# Patient Record
Sex: Female | Born: 1990 | Race: White | Hispanic: No | Marital: Single | Smoking: Never smoker
Health system: Southern US, Community
[De-identification: ages and names within clinical notes are randomized; demographics above are authoritative.]

## PROBLEM LIST (undated history)

## (undated) DIAGNOSIS — N289 Disorder of kidney and ureter, unspecified: Secondary | ICD-10-CM

## (undated) HISTORY — PX: TONSILLECTOMY: SUR1361

## (undated) HISTORY — PX: ORTHOPEDIC SURGERY: SHX850

---

## 2011-12-17 DIAGNOSIS — F988 Other specified behavioral and emotional disorders with onset usually occurring in childhood and adolescence: Secondary | ICD-10-CM | POA: Insufficient documentation

## 2018-12-08 DIAGNOSIS — G5600 Carpal tunnel syndrome, unspecified upper limb: Secondary | ICD-10-CM | POA: Insufficient documentation

## 2020-07-04 ENCOUNTER — Emergency Department (HOSPITAL_COMMUNITY)
Admission: EM | Admit: 2020-07-04 | Discharge: 2020-07-05 | Disposition: A | Discharge status: Home / Self Care | Payer: Medicaid Other | Attending: Emergency Medicine | Admitting: Emergency Medicine

## 2020-07-04 ENCOUNTER — Other Ambulatory Visit: Payer: Self-pay

## 2020-07-04 ENCOUNTER — Encounter (HOSPITAL_COMMUNITY): Payer: Self-pay | Admitting: *Deleted

## 2020-07-04 ENCOUNTER — Emergency Department (HOSPITAL_COMMUNITY): Payer: Medicaid Other

## 2020-07-04 DIAGNOSIS — R102 Pelvic and perineal pain: Secondary | ICD-10-CM

## 2020-07-04 DIAGNOSIS — Z3A01 Less than 8 weeks gestation of pregnancy: Secondary | ICD-10-CM | POA: Insufficient documentation

## 2020-07-04 DIAGNOSIS — Z3490 Encounter for supervision of normal pregnancy, unspecified, unspecified trimester: Secondary | ICD-10-CM

## 2020-07-04 DIAGNOSIS — R109 Unspecified abdominal pain: Secondary | ICD-10-CM | POA: Insufficient documentation

## 2020-07-04 DIAGNOSIS — M545 Low back pain, unspecified: Secondary | ICD-10-CM

## 2020-07-04 DIAGNOSIS — O26891 Other specified pregnancy related conditions, first trimester: Secondary | ICD-10-CM | POA: Diagnosis present

## 2020-07-04 LAB — BASIC METABOLIC PANEL
Anion gap: 11 (ref 5–15)
BUN: 9 mg/dL (ref 6–20)
CO2: 21 mmol/L — ABNORMAL LOW (ref 22–32)
Calcium: 8.9 mg/dL (ref 8.9–10.3)
Chloride: 104 mmol/L (ref 98–111)
Creatinine, Ser: 0.5 mg/dL (ref 0.44–1.00)
GFR calc Af Amer: >60 mL/min (ref >60)
GFR calc non Af Amer: >60 mL/min (ref >60)
Glucose, Bld: 82 mg/dL (ref 70–99)
Potassium: 3.9 mmol/L (ref 3.5–5.1)
Sodium: 136 mmol/L (ref 135–145)

## 2020-07-04 LAB — CBC
HCT: 39.5 % (ref 36.0–46.0)
Hemoglobin: 13.2 g/dL (ref 12.0–15.0)
MCH: 30.1 pg (ref 26.0–34.0)
MCHC: 33.4 g/dL (ref 30.0–36.0)
MCV: 90.2 fL (ref 80.0–100.0)
Platelets: 215 10*3/uL (ref 150–400)
RBC: 4.38 MIL/uL (ref 3.87–5.11)
RDW: 12.7 % (ref 11.5–15.5)
WBC: 10.1 10*3/uL (ref 4.0–10.5)
nRBC: 0 % (ref 0.0–0.2)

## 2020-07-04 LAB — HCG, QUANTITATIVE, PREGNANCY: hCG, Beta Chain, Quant, S: 2496 m[IU]/mL — ABNORMAL HIGH (ref <5)

## 2020-07-04 LAB — ABO/RH: ABO/RH(D): A POS

## 2020-07-04 NOTE — ED Provider Notes (Signed)
[facility] EMERGENCY DEPARTMENT Provider Note   CSN: [id number] Arrival date & time: 07/04/20  1544     History Chief Complaint  Patient presents with  . Abdominal Cramping    Chelsea Little is a 29 y.o. female.  Patient reports intermittent lower back cramping for the past 5 days.  This comes and goes lasting for several minutes to hours at a time.  She has this pain similar in the past when she had a miscarriage.  She became concerned today took her pregnancy test which was positive.  She is not uncertain when her last menstrual cycle was states she has irregular periods.  Denies any abdominal pain or vaginal bleeding.  No dysuria or frequency.  No vaginal discharge.  No fevers, chills, chest pain, shortness of breath.  No abdominal pain.  Reports that she had a miscarriage at 9 weeks several years ago.  She also has a 33-year-old child.  Is her third pregnancy.  He does not have a PCP in the area. Does not have any abdominal pain or back pain currently.  The history is provided by the patient.  Abdominal Cramping Pertinent negatives include no chest pain, no abdominal pain, no headaches and no shortness of breath.       History reviewed. No pertinent past medical history.  There are no problems to display for this patient.   Past Surgical History:  Procedure Laterality Date  . ORTHOPEDIC SURGERY    . TONSILLECTOMY       OB History   No obstetric history on file.     No family history on file.  Social History   Tobacco Use  . Smoking status: Never Smoker  . Smokeless tobacco: Never Used  Substance Use Topics  . Alcohol use: Not on file  . Drug use: Not on file    Home Medications Prior to Admission medications   Not on File    Allergies    Patient has no known allergies.  Review of Systems   Review of Systems  Constitutional: Negative for activity change, appetite change and fever.  HENT: Negative for congestion and rhinorrhea.   Eyes: Negative for  visual disturbance.  Respiratory: Negative for cough and shortness of breath.   Cardiovascular: Negative for chest pain.  Gastrointestinal: Negative for abdominal pain, nausea and vomiting.  Genitourinary: Negative for decreased urine volume, dysuria, hematuria, pelvic pain, urgency, vaginal bleeding and vaginal discharge.  Musculoskeletal: Positive for back pain.  Neurological: Negative for dizziness, weakness and headaches.   all other systems are negative except as noted in the HPI and PMH.    Physical Exam Updated Vital Signs BP 135/69 (BP Location: Right Arm)   Pulse 78   Temp 98.6 F (37 C) (Oral)   Resp 16   Ht 5\' 1"  (1.549 m)   Wt 72.6 kg   SpO2 100%   BMI 30.23 kg/m   Physical Exam Vitals and nursing note reviewed.  Constitutional:      General: She is not in acute distress.    Appearance: She is well-developed.  HENT:     Head: Normocephalic and atraumatic.     Mouth/Throat:     Pharynx: No oropharyngeal exudate.  Eyes:     Conjunctiva/sclera: Conjunctivae normal.     Pupils: Pupils are equal, round, and reactive to light.  Neck:     Comments: No meningismus. Cardiovascular:     Rate and Rhythm: Normal rate and regular rhythm.     Heart sounds: Normal  heart sounds. No murmur heard.   Pulmonary:     Effort: Pulmonary effort is normal. No respiratory distress.     Breath sounds: Normal breath sounds.  Abdominal:     Palpations: Abdomen is soft.     Tenderness: There is no abdominal tenderness. There is no guarding or rebound.     Comments: Soft and nontender abdomen  Genitourinary:    Comments: Chaperone present. Kayla RN normal external genitalia.  White discharge in vaginal vault.  No CMT.  Cervix is closed.  No lateralizing adnexal tenderness Musculoskeletal:        General: No tenderness. Normal range of motion.     Cervical back: Normal range of motion and neck supple.     Comments: No CVA tenderness  Skin:    General: Skin is warm.  Neurological:       Mental Status: She is alert and oriented to person, place, and time.     Cranial Nerves: No cranial nerve deficit.     Motor: No abnormal muscle tone.     Coordination: Coordination normal.     Comments: No ataxia on finger to nose bilaterally. No pronator drift. 5/5 strength throughout. CN 2-12 intact.Equal grip strength. Sensation intact.   Psychiatric:        Behavior: Behavior normal.     ED Results / Procedures / Treatments   Labs (all labs ordered are listed, but only abnormal results are displayed) Labs Reviewed  HCG, QUANTITATIVE, PREGNANCY - Abnormal; Notable for the following components:      Result Value   hCG, Beta Chain, Quant, S 2,496 (*)    All other components within normal limits  BASIC METABOLIC PANEL - Abnormal; Notable for the following components:   CO2 21 (*)    All other components within normal limits  WET PREP, GENITAL  CBC  URINALYSIS, ROUTINE W REFLEX MICROSCOPIC  ABO/RH  GC/CHLAMYDIA PROBE AMP ([place]) NOT AT [facility]    EKG None  Radiology US OB LESS THAN 14 WEEKS WITH OB TRANSVAGINAL  Result Date: 07/04/2020 CLINICAL DATA:  29 year old pregnant female with pelvic pain. EXAM: OBSTETRIC <14 WK [place] AND TRANSVAGINAL OB US TECHNIQUE: Both transabdominal and transvaginal ultrasound examinations were performed for complete evaluation of the gestation as well as the maternal uterus, adnexal regions, and pelvic cul-de-sac. Transvaginal technique was performed to assess early pregnancy. COMPARISON:  None. FINDINGS: Intrauterine gestational sac: Single intrauterine gestational sac. Yolk sac:  Seen Embryo:  Not present Cardiac Activity: N/A MSD: 6 mm   5 w   2 d Subchorionic hemorrhage:  Small subchorionic hemorrhage. Maternal uterus/adnexae: The maternal ovaries are unremarkable. No significant free fluid within the pelvis. IMPRESSION: Single intrauterine gestational sac with an estimated gestational age of [redacted] weeks, 2 days. No fetal pole identified at this  time. Follow-up with ultrasound in 7-11 days, or earlier if clinically indicated, recommended. Electronically Signed   By: Elgie Collard M.D.   On: 07/04/2020 21:00    Procedures Procedures (including critical care time)  Medications Ordered in ED Medications - No data to display  ED Course  I have reviewed the triage vital signs and the nursing notes.  Pertinent labs & imaging results that were available during my care of the patient were reviewed by me and considered in my medical decision making (see chart for details).    MDM Rules/Calculators/A&P  5 days of intermittent cramping across her low back consistent with previous episode of miscarriage.  Found to be pregnant today at home when she became concerned.  No abdominal pain or vaginal bleeding.  Abdomen is soft without peritoneal signs.  hCG today is positive at 2400.  Ultrasound shows single intrauterine gestational sac at 5 weeks 2 days without fetal pole.  UA negative.  Rh+. Not a rhogam candidate. No bleeding.   Followup with gynecology for repeat US in 7-10 days. D/w patient cannot determine whether this will be a miscarriage or a normal pregnancy.  Needs repeat US to assess progression of this pregnancy. Return to the ED with worsening pain, bleeding, or any other concerns.   Final Clinical Impression(s) / ED Diagnoses Final diagnoses:  Early stage of pregnancy  Acute bilateral low back pain without sciatica    Rx / DC Orders ED Discharge Orders    None       Vira Chaplin, Jeannett Senior, MD 07/05/20 0106

## 2020-07-05 LAB — URINALYSIS, ROUTINE W REFLEX MICROSCOPIC
Bilirubin Urine: NEGATIVE
Glucose, UA: NEGATIVE mg/dL
Hgb urine dipstick: NEGATIVE
Ketones, ur: NEGATIVE mg/dL
Leukocytes,Ua: NEGATIVE
Nitrite: NEGATIVE
Protein, ur: NEGATIVE mg/dL
Specific Gravity, Urine: 1.016 (ref 1.005–1.030)
pH: 5 (ref 5.0–8.0)

## 2020-07-05 LAB — WET PREP, GENITAL
Clue Cells Wet Prep HPF POC: NONE SEEN
Sperm: NONE SEEN
Trich, Wet Prep: NONE SEEN
Yeast Wet Prep HPF POC: NONE SEEN

## 2020-07-05 NOTE — Discharge Instructions (Signed)
AS we discussed, your ultrasound shows a gestational sac but not a fetal pole. You need to have another ultrasound in 7-10 days. At this time, we cannot determine whether this will be a normal pregnancy or a miscarriage. You should follow up with the gynecologist next week. Return to the ED if you develop worsening pain, bleeding, vomiting, fever, or any other concerns.

## 2020-07-06 LAB — GC/CHLAMYDIA PROBE AMP (~~LOC~~) NOT AT ARMC
Chlamydia: NEGATIVE
Comment: NEGATIVE
Comment: NORMAL
Neisseria Gonorrhea: NEGATIVE

## 2020-10-05 DIAGNOSIS — Z8279 Family history of other congenital malformations, deformations and chromosomal abnormalities: Secondary | ICD-10-CM | POA: Insufficient documentation

## 2020-10-05 DIAGNOSIS — Z141 Cystic fibrosis carrier: Secondary | ICD-10-CM | POA: Insufficient documentation

## 2020-10-11 DIAGNOSIS — G43909 Migraine, unspecified, not intractable, without status migrainosus: Secondary | ICD-10-CM | POA: Insufficient documentation

## 2020-12-02 ENCOUNTER — Ambulatory Visit: Admit: 2020-12-02 | Discharge status: Against Medical Advice | Payer: Medicaid Other

## 2021-05-27 ENCOUNTER — Other Ambulatory Visit: Payer: Self-pay

## 2021-05-27 ENCOUNTER — Ambulatory Visit
Admission: RE | Admit: 2021-05-27 | Discharge: 2021-05-27 | Disposition: A | Discharge status: Home / Self Care | Payer: Medicaid Other | Source: Ambulatory Visit

## 2021-05-27 VITALS — BP 116/76 | HR 92 | Temp 98.4°F | Resp 16

## 2021-05-27 DIAGNOSIS — N6001 Solitary cyst of right breast: Secondary | ICD-10-CM

## 2021-05-27 MED ORDER — CEPHALEXIN 500 MG PO CAPS
500.0000 mg | ORAL_CAPSULE | Freq: Three times a day (TID) | ORAL | 0 refills | Status: AC
Start: 2021-05-27 — End: 2021-06-06

## 2021-05-27 NOTE — ED Triage Notes (Signed)
Patient presents to Urgent Care with complaints of a "spot on right nipple that was drained in Jan" Pt states a week ago she developed same sore and with breast feeding she noted drainage. [facility] come in for eval.  Denies fever.

## 2021-05-27 NOTE — ED Provider Notes (Signed)
[facility]    CSN: [id number] Arrival date & time: 05/27/21  1056      History   Chief Complaint Chief Complaint  Patient presents with   Appointment    1100    Breast Discharge    HPI Chelsea Little is a 30 y.o. female.   HPI Patient presents with cyst which is actively draining from the right nipple. She developed a similar cyst in the same area which required I&D. The cyst at present has drained over the last week. She is breastfeeding and concern for possible infection.No fever. She I producing milk normally. History reviewed. No pertinent past medical history.  There are no problems to display for this patient.   Past Surgical History:  Procedure Laterality Date   CESAREAN SECTION     ORTHOPEDIC SURGERY     TONSILLECTOMY      OB History   No obstetric history on file.      Home Medications    Prior to Admission medications   Medication Sig Start Date End Date Taking? Authorizing Provider  cephALEXin (KEFLEX) 500 MG capsule Take 1 capsule (500 mg total) by mouth 3 (three) times daily for 10 days. 05/27/21 06/06/21 Yes Bing Neighbors, FNP  norethindrone (MICRONOR) 0.35 MG tablet Take 1 tablet by mouth daily. 05/20/21   [provider]    Family History History reviewed. No pertinent family history.  Social History Social History   Tobacco Use   Smoking status: Never   Smokeless tobacco: Never  Substance Use Topics   Alcohol use: Not Currently   Drug use: Never     Allergies   Topiramate, Gabapentin, Sumatriptan, Amoxicillin-pot clavulanate, and Rizatriptan   Review of Systems Review of Systems Pertinent negatives listed in HPI   Physical Exam Triage Vital Signs ED Triage Vitals  Enc Vitals Group     BP 05/27/21 1110 116/76     Pulse Rate 05/27/21 1110 92     Resp 05/27/21 1110 16     Temp 05/27/21 1110 98.4 F (36.9 C)     Temp Source 05/27/21 1110 Oral     SpO2 05/27/21 1110 98 %     Weight --      Height --       Head Circumference --      Peak Flow --      Pain Score 05/27/21 1107 3     Pain Loc --      Pain Edu? --      Excl. in GC? --    No data found.  Updated Vital Signs BP 116/76 (BP Location: Right Arm)   Pulse 92   Temp 98.4 F (36.9 C) (Oral)   Resp 16   SpO2 98%   Breastfeeding Yes   Visual Acuity Right Eye Distance:   Left Eye Distance:   Bilateral Distance:    Right Eye Near:   Left Eye Near:    Bilateral Near:     Physical Exam Exam conducted with a chaperone present.  Constitutional:      Appearance: Normal appearance.  HENT:     Head: Normocephalic.  Cardiovascular:     Rate and Rhythm: Normal rate and regular rhythm.  Pulmonary:     Effort: Pulmonary effort is normal.     Breath sounds: Normal breath sounds and air entry.  Chest:     Chest wall: Swelling and tenderness present.    Psychiatric:        Attention and Perception:  Attention and perception normal.        Mood and Affect: Mood normal.        Behavior: Behavior is cooperative.     UC Treatments / Results  Labs (all labs ordered are listed, but only abnormal results are displayed) Labs Reviewed - No data to display  EKG   Radiology No results found.  Procedures Procedures (including critical care time)  Medications Ordered in UC Medications - No data to display  Initial Impression / Assessment and Plan / UC Course  I have reviewed the triage vital signs and the nursing notes.  Pertinent labs & imaging results that were available during my care of the patient were reviewed by me and considered in my medical decision making (see chart for details).    Cyst of nipple (right) . Will cover with Cephalxin TID x 10 days. Follow-up with PCP if this becomes recurrent as you may need a breast ultrasound and referral to general surgery. Final Clinical Impressions(s) / UC Diagnoses   Final diagnoses:  Cyst of nipple, right   Discharge Instructions   None    ED Prescriptions      Medication Sig Dispense Auth. Provider   cephALEXin (KEFLEX) 500 MG capsule Take 1 capsule (500 mg total) by mouth 3 (three) times daily for 10 days. 30 capsule Bing Neighbors, FNP      PDMP not reviewed this encounter.   Bing Neighbors, [place] 06/01/21 [phone]

## 2021-05-27 NOTE — ED Notes (Signed)
Appointment scheduled with a primary care PCP prior to discharge

## 2021-06-11 ENCOUNTER — Other Ambulatory Visit: Payer: Self-pay

## 2021-06-11 ENCOUNTER — Emergency Department (HOSPITAL_COMMUNITY)
Admission: EM | Admit: 2021-06-11 | Discharge: 2021-06-12 | Disposition: A | Discharge status: Home / Self Care | Payer: Medicaid Other | Attending: Emergency Medicine | Admitting: Emergency Medicine

## 2021-06-11 ENCOUNTER — Emergency Department (HOSPITAL_COMMUNITY): Payer: Medicaid Other

## 2021-06-11 ENCOUNTER — Encounter (HOSPITAL_COMMUNITY): Payer: Self-pay | Admitting: *Deleted

## 2021-06-11 DIAGNOSIS — N9489 Other specified conditions associated with female genital organs and menstrual cycle: Secondary | ICD-10-CM | POA: Diagnosis not present

## 2021-06-11 DIAGNOSIS — R509 Fever, unspecified: Secondary | ICD-10-CM | POA: Diagnosis present

## 2021-06-11 DIAGNOSIS — N390 Urinary tract infection, site not specified: Secondary | ICD-10-CM | POA: Insufficient documentation

## 2021-06-11 DIAGNOSIS — N12 Tubulo-interstitial nephritis, not specified as acute or chronic: Secondary | ICD-10-CM | POA: Diagnosis not present

## 2021-06-11 LAB — URINALYSIS, ROUTINE W REFLEX MICROSCOPIC
Bacteria, UA: NONE SEEN
Bilirubin Urine: NEGATIVE
Glucose, UA: NEGATIVE mg/dL
Ketones, ur: 80 mg/dL — AB
Nitrite: NEGATIVE
Protein, ur: 30 mg/dL — AB
RBC / HPF: >50 RBC/hpf — ABNORMAL HIGH (ref 0–5)
Specific Gravity, Urine: 1.028 (ref 1.005–1.030)
pH: 5 (ref 5.0–8.0)

## 2021-06-11 LAB — CBC
HCT: 36 % (ref 36.0–46.0)
Hemoglobin: 12.5 g/dL (ref 12.0–15.0)
MCH: 29.8 pg (ref 26.0–34.0)
MCHC: 34.7 g/dL (ref 30.0–36.0)
MCV: 85.7 fL (ref 80.0–100.0)
Platelets: 162 10*3/uL (ref 150–400)
RBC: 4.2 MIL/uL (ref 3.87–5.11)
RDW: 13 % (ref 11.5–15.5)
WBC: 7.5 10*3/uL (ref 4.0–10.5)
nRBC: 0 % (ref 0.0–0.2)

## 2021-06-11 MED ORDER — ONDANSETRON HCL 4 MG/2ML IJ SOLN
4.0000 mg | Freq: Once | INTRAMUSCULAR | Status: AC
  Administered 2021-06-11: 4 mg via INTRAVENOUS
  Filled 2021-06-11: qty 2

## 2021-06-11 MED ORDER — CIPROFLOXACIN IN D5W 400 MG/200ML IV SOLN
400.0000 mg | Freq: Once | INTRAVENOUS | Status: AC
  Administered 2021-06-11: 400 mg via INTRAVENOUS
  Filled 2021-06-11: qty 200

## 2021-06-11 MED ORDER — KETOROLAC TROMETHAMINE 15 MG/ML IJ SOLN
15.0000 mg | Freq: Once | INTRAMUSCULAR | Status: AC
  Administered 2021-06-11: 15 mg via INTRAVENOUS
  Filled 2021-06-11: qty 1

## 2021-06-11 MED ORDER — SODIUM CHLORIDE 0.9 % IV BOLUS
1000.0000 mL | Freq: Once | INTRAVENOUS | Status: AC
  Administered 2021-06-11: 1000 mL via INTRAVENOUS

## 2021-06-11 NOTE — ED Triage Notes (Signed)
Pt with fever off and on since midnight.  Seen at [facility] last night and dx with UTI and kidney stone. Started on antibiotics.  Pt states 102.2 fever at 1700 and took tylenol.

## 2021-06-11 NOTE — ED Provider Notes (Signed)
[facility] Emergency Department Provider Note MRN:  [id number]  Arrival date & time: 06/12/21     Chief Complaint   Fever   History of Present Illness   Chelsea Little is a 30 y.o. year-old female with no pertinent past medical presenting to the ED with chief complaint of fever.  Has been having right-sided flank pain for the past several days, burning with urination.  Started having fevers yesterday.  Was diagnosed with a kidney stone as well as UTI at [facility] yesterday.  Discharged on nitrofurantoin.  Still feeling bad, still having fevers.  Seems to be worsening.  Symptoms constant, no exacerbating or relieving factors.  Denies any chest pain or shortness of breath.  Review of Systems  A complete 10 system review of systems was obtained and all systems are negative except as noted in the HPI and PMH.   Patient's Health History   History reviewed. No pertinent past medical history.  Past Surgical History:  Procedure Laterality Date   CESAREAN SECTION     ORTHOPEDIC SURGERY     TONSILLECTOMY      History reviewed. No pertinent family history.  Social History   Socioeconomic History   Marital status: Single    Spouse name: Not on file   Number of children: Not on file   Years of education: Not on file   Highest education level: Not on file  Occupational History   Not on file  Tobacco Use   Smoking status: Never   Smokeless tobacco: Never  Substance and Sexual Activity   Alcohol use: Not Currently   Drug use: Never   Sexual activity: Not on file  Other Topics Concern   Not on file  Social History Narrative   Not on file   Social Determinants of Health   Financial Resource Strain: Not on file  Food Insecurity: Not on file  Transportation Needs: Not on file  Physical Activity: Not on file  Stress: Not on file  Social Connections: Not on file  Intimate Partner Violence: Not on file     Physical Exam   Vitals:   06/12/21  0000 06/12/21 0100  BP: 130/73 128/78  Pulse: 96 99  Resp: 18 18  Temp:    SpO2: 96% 99%    CONSTITUTIONAL: Well-appearing, NAD NEURO:  Alert and oriented x 3, no focal deficits EYES:  eyes equal and reactive ENT/NECK:  no LAD, no JVD CARDIO: Regular rate, well-perfused, normal S1 and S2 PULM:  CTAB no wheezing or rhonchi GI/GU:  normal bowel sounds, non-distended, non-tender MSK/SPINE:  No gross deformities, no edema SKIN:  no rash, atraumatic PSYCH:  Appropriate speech and behavior  *Additional and/or pertinent findings included in MDM below  Diagnostic and Interventional Summary    EKG Interpretation  Date/Time:    Ventricular Rate:    PR Interval:    QRS Duration:   QT Interval:    QTC Calculation:   R Axis:     Text Interpretation:         Labs Reviewed  URINALYSIS, ROUTINE W REFLEX MICROSCOPIC - Abnormal; Notable for the following components:      Result Value   Color, Urine AMBER (*)    APPearance HAZY (*)    Hgb urine dipstick MODERATE (*)    Ketones, ur 80 (*)    Protein, ur 30 (*)    Leukocytes,Ua SMALL (*)    RBC / HPF >50 (*)    All other components within normal  limits  COMPREHENSIVE METABOLIC PANEL - Abnormal; Notable for the following components:   Potassium 3.2 (*)    Calcium 8.7 (*)    Total Bilirubin 1.3 (*)    All other components within normal limits  URINE CULTURE  CBC  HCG, SERUM, QUALITATIVE    CT RENAL STONE STUDY  Final Result      Medications  sodium chloride 0.9 % bolus 1,000 mL (0 mLs Intravenous Stopped 06/12/21 0057)  ciprofloxacin (CIPRO) IVPB 400 mg (0 mg Intravenous Stopped 06/12/21 0057)  ketorolac (TORADOL) 15 MG/ML injection 15 mg (15 mg Intravenous Given 06/11/21 2333)  ondansetron (ZOFRAN) injection 4 mg (4 mg Intravenous Given 06/11/21 2333)     Procedures  /  Critical Care Procedures  ED Course and Medical Decision Making  I have reviewed the triage vital signs, the nursing notes, and pertinent available  records from the EMR.  Listed above are laboratory and imaging tests that I personally ordered, reviewed, and interpreted and then considered in my medical decision making (see below for details).  Concern for infected kidney stone, will need to repeat labs, CT, provide empiric antibiotics, consider urology consult.     CT shows a nonobstructing kidney stone within the kidney, some evidence of cystitis.  Given this and her flank pain, suspect pyelonephritis.  Patient's heart rate is improved, she is resting comfortably, appropriate for discharge on antibiotics.  Elmer Sow. Pilar Plate, MD [facility] Emergency Medicine [facility] [email or web address]  Final Clinical Impressions(s) / ED Diagnoses     ICD-10-CM   1. Pyelonephritis  N12       ED Discharge Orders          Ordered    ciprofloxacin (CIPRO) 500 MG tablet  Every 12 hours        06/12/21 0214             Discharge Instructions Discussed with and Provided to Patient:     Discharge Instructions      You were evaluated in the Emergency Department and after careful evaluation, we did not find any emergent condition requiring admission or further testing in the hospital.  Your exam/testing today is overall reassuring.  Symptoms seem to be due to a kidney infection, also known as pyelonephritis.  Please stop taking the nitrofurantoin and start taking the ciprofloxacin antibiotic.  Use Tylenol or Motrin for pain.  Please return to the Emergency Department if you experience any worsening of your condition.   Thank you for allowing [place] to be a part of your care.        Sabas Sous, MD 06/12/21 [phone]

## 2021-06-12 LAB — COMPREHENSIVE METABOLIC PANEL
ALT: 28 U/L (ref 0–44)
AST: 21 U/L (ref 15–41)
Albumin: 4.1 g/dL (ref 3.5–5.0)
Alkaline Phosphatase: 97 U/L (ref 38–126)
Anion gap: 8 (ref 5–15)
BUN: 18 mg/dL (ref 6–20)
CO2: 22 mmol/L (ref 22–32)
Calcium: 8.7 mg/dL — ABNORMAL LOW (ref 8.9–10.3)
Chloride: 106 mmol/L (ref 98–111)
Creatinine, Ser: 0.77 mg/dL (ref 0.44–1.00)
GFR, Estimated: >60 mL/min (ref >60)
Glucose, Bld: 93 mg/dL (ref 70–99)
Potassium: 3.2 mmol/L — ABNORMAL LOW (ref 3.5–5.1)
Sodium: 136 mmol/L (ref 135–145)
Total Bilirubin: 1.3 mg/dL — ABNORMAL HIGH (ref 0.3–1.2)
Total Protein: 7.4 g/dL (ref 6.5–8.1)

## 2021-06-12 LAB — HCG, SERUM, QUALITATIVE: Preg, Serum: NEGATIVE

## 2021-06-12 MED ORDER — ONDANSETRON 4 MG PO TBDP: 4.0000 mg | ORAL_TABLET | Freq: Three times a day (TID) | ORAL | 0 refills | Status: DC | PRN

## 2021-06-12 MED ORDER — CIPROFLOXACIN HCL 500 MG PO TABS: 500.0000 mg | ORAL_TABLET | Freq: Two times a day (BID) | ORAL | 0 refills | Status: AC

## 2021-06-12 NOTE — Discharge Instructions (Addendum)
You were evaluated in the Emergency Department and after careful evaluation, we did not find any emergent condition requiring admission or further testing in the hospital.  Your exam/testing today is overall reassuring.  Symptoms seem to be due to a kidney infection, also known as pyelonephritis.  Please stop taking the nitrofurantoin and start taking the ciprofloxacin antibiotic.  Use Tylenol or Motrin for pain.  Please return to the Emergency Department if you experience any worsening of your condition.   Thank you for allowing [place] to be a part of your care.

## 2021-06-13 LAB — URINE CULTURE: Culture: <10000 — AB

## 2021-07-20 ENCOUNTER — Ambulatory Visit (INDEPENDENT_AMBULATORY_CARE_PROVIDER_SITE_OTHER): Payer: Medicaid Other | Admitting: Nurse Practitioner

## 2021-09-25 ENCOUNTER — Ambulatory Visit
Admission: EM | Admit: 2021-09-25 | Discharge: 2021-09-25 | Disposition: A | Discharge status: Home / Self Care | Payer: Medicaid Other | Attending: Urgent Care | Admitting: Urgent Care

## 2021-09-25 ENCOUNTER — Other Ambulatory Visit: Payer: Self-pay

## 2021-09-25 DIAGNOSIS — L219 Seborrheic dermatitis, unspecified: Secondary | ICD-10-CM

## 2021-09-25 MED ORDER — KETOCONAZOLE 2 % EX SHAM: 1.0000 "application " | MEDICATED_SHAMPOO | Freq: Two times a day (BID) | CUTANEOUS | 0 refills | Status: AC

## 2021-09-25 MED ORDER — TRIAMCINOLONE ACETONIDE 0.1 % EX LOTN: 1.0000 "application " | TOPICAL_LOTION | Freq: Two times a day (BID) | CUTANEOUS | 0 refills | Status: AC

## 2021-09-25 NOTE — ED Triage Notes (Signed)
Patient presents to Urgent Care with complaints of rash located on her scalp x 2 years. Pt states she has used all types of dandruff shampoos with no improvement.

## 2021-09-25 NOTE — ED Provider Notes (Signed)
[facility]   MRN: [id number] DOB: August 25, 1991  Subjective:   Chelsea Little is a 30 y.o. female presenting for 2-year history of persistent intermittent irritation, itching of the back of her scalp.  Has tried multiple over-the-counter shampoos to treat for fungal infection but has not resolved.  No drainage of pus or bleeding, fevers, history of skin issues.  No current facility-administered medications for this encounter.  Current Outpatient Medications:    norethindrone (MICRONOR) 0.35 MG tablet, Take 1 tablet by mouth daily., Disp: , Rfl:    ondansetron (ZOFRAN ODT) 4 MG disintegrating tablet, Take 1 tablet (4 mg total) by mouth every 8 (eight) hours as needed for nausea or vomiting., Disp: 20 tablet, Rfl: 0   Allergies  Allergen Reactions   Topiramate Itching, Other (See Comments) and Rash    syncope syncope    Gabapentin Other (See Comments)    Unable to tolerate Unable to tolerate    Sumatriptan Hives    Migraine products Migraine products Migraine products Migraine products    Amoxicillin-Pot Clavulanate Diarrhea, Itching, Other (See Comments) and Rash    As a child no problems with amoxil As a child no problems with amoxil    Rizatriptan Rash and Hives    Migraine products Migraine products Migraine products Migraine products     History reviewed. No pertinent past medical history.   Past Surgical History:  Procedure Laterality Date   CESAREAN SECTION     ORTHOPEDIC SURGERY     TONSILLECTOMY      History reviewed. No pertinent family history.  Social History   Tobacco Use   Smoking status: Never   Smokeless tobacco: Never  Substance Use Topics   Alcohol use: Not Currently   Drug use: Never    ROS   Objective:   Vitals: BP 112/67 (BP Location: Right Arm)   Pulse 69   Temp 98 F (36.7 C) (Oral)   Resp 16   SpO2 97%   Breastfeeding Yes   Physical Exam Constitutional:      General: She is not in acute distress.     Appearance: Normal appearance. She is well-developed. She is not ill-appearing, toxic-appearing or diaphoretic.  HENT:     Head: Normocephalic and atraumatic.      Nose: Nose normal.     Mouth/Throat:     Mouth: Mucous membranes are moist.     Pharynx: Oropharynx is clear.  Eyes:     General: No scleral icterus.       Right eye: No discharge.        Left eye: No discharge.     Extraocular Movements: Extraocular movements intact.     Conjunctiva/sclera: Conjunctivae normal.     Pupils: Pupils are equal, round, and reactive to light.  Cardiovascular:     Rate and Rhythm: Normal rate.  Pulmonary:     Effort: Pulmonary effort is normal.  Skin:    General: Skin is warm and dry.  Neurological:     General: No focal deficit present.     Mental Status: She is alert and oriented to person, place, and time.  Psychiatric:        Mood and Affect: Mood normal.        Behavior: Behavior normal.        Thought Content: Thought content normal.        Judgment: Judgment normal.    Assessment and Plan :   PDMP not reviewed this encounter.  1. Seborrheic dermatitis  Will manage for seborrheic dermatitis with triamcinolone lotion, ketoconazole foam. Counseled patient on potential for adverse effects with medications prescribed/recommended today, ER and return-to-clinic precautions discussed, patient verbalized understanding.    Wallis Bamberg, [place] 09/25/21 1207

## 2021-09-26 ENCOUNTER — Ambulatory Visit: Payer: Medicaid Other | Admitting: Internal Medicine

## 2021-10-20 ENCOUNTER — Other Ambulatory Visit: Payer: Self-pay

## 2021-10-20 ENCOUNTER — Ambulatory Visit
Admission: RE | Admit: 2021-10-20 | Discharge: 2021-10-20 | Disposition: A | Discharge status: Home / Self Care | Payer: Medicaid Other | Source: Ambulatory Visit | Attending: Family Medicine | Admitting: Family Medicine

## 2021-10-20 VITALS — BP 135/79 | HR 81 | Temp 98.1°F | Resp 18

## 2021-10-20 DIAGNOSIS — R21 Rash and other nonspecific skin eruption: Secondary | ICD-10-CM | POA: Diagnosis not present

## 2021-10-20 DIAGNOSIS — S0002XA Blister (nonthermal) of scalp, initial encounter: Secondary | ICD-10-CM | POA: Diagnosis not present

## 2021-10-20 DIAGNOSIS — L089 Local infection of the skin and subcutaneous tissue, unspecified: Secondary | ICD-10-CM | POA: Diagnosis not present

## 2021-10-20 MED ORDER — KETOCONAZOLE 2 % EX CREA: 1.0000 "application " | TOPICAL_CREAM | Freq: Two times a day (BID) | CUTANEOUS | 0 refills | Status: AC | PRN

## 2021-10-20 MED ORDER — SULFAMETHOXAZOLE-TRIMETHOPRIM 800-160 MG PO TABS: 1.0000 | ORAL_TABLET | Freq: Two times a day (BID) | ORAL | 0 refills | Status: AC

## 2021-10-20 NOTE — ED Triage Notes (Signed)
Patient states she was here a few weeks ago and the doctor looked at her scalp and was prescribed a shampoo.   She states that the rash is more spread out and there is 2 bumps on left side of her neck that are sore to touch. The pain goes down into her shoulder.  Denies Meds   Denies Fever.

## 2021-10-20 NOTE — ED Provider Notes (Signed)
[facility]    CSN: [id number] Arrival date & time: 10/20/21  0950      History   Chief Complaint No chief complaint on file.   HPI Chelsea Little is a 30 y.o. female.   Patient presenting today with chronic intermittent rash of scalp that is itchy and burns.  States she was seen recently at this clinic and given ketoconazole shampoo, triamcinolone cream which has not been helping and the areas are worsening, now several open sores that are swelling and some swollen lymph nodes.  Denies fever, chills, new products or exposures.  Does have history of eczema but otherwise no known chronic dermatologic diagnoses.  States she had to stop the ketoconazole shampoo about a week or so ago as it was burning too bad on the open sores.   History reviewed. No pertinent past medical history.  There are no problems to display for this patient.   Past Surgical History:  Procedure Laterality Date   CESAREAN SECTION     ORTHOPEDIC SURGERY     TONSILLECTOMY      OB History   No obstetric history on file.      Home Medications    Prior to Admission medications   Medication Sig Start Date End Date Taking? Authorizing Provider  ketoconazole (NIZORAL) 2 % cream Apply 1 application topically 2 (two) times daily as needed for irritation. 10/20/21  Yes Particia Nearing, PA-C  sulfamethoxazole-trimethoprim (BACTRIM DS) 800-160 MG tablet Take 1 tablet by mouth 2 (two) times daily for 7 days. 10/20/21 10/27/21 Yes Particia Nearing, PA-C  ketoconazole (NIZORAL) 2 % shampoo Apply 1 application topically 2 (two) times daily. 09/25/21   Wallis Bamberg, PA-C  norethindrone (MICRONOR) 0.35 MG tablet Take 1 tablet by mouth daily. 05/20/21   [provider]  ondansetron (ZOFRAN ODT) 4 MG disintegrating tablet Take 1 tablet (4 mg total) by mouth every 8 (eight) hours as needed for nausea or vomiting. 06/12/21   Sabas Sous, MD  triamcinolone lotion (KENALOG) 0.1 % Apply 1  application topically 2 (two) times daily. 09/25/21   Wallis Bamberg, PA-C    Family History History reviewed. No pertinent family history.  Social History Social History   Tobacco Use   Smoking status: Never   Smokeless tobacco: Never  Substance Use Topics   Alcohol use: Not Currently   Drug use: Never     Allergies   Topiramate, Gabapentin, Sumatriptan, Amoxicillin-pot clavulanate, and Rizatriptan   Review of Systems Review of Systems Per HPI  Physical Exam Triage Vital Signs ED Triage Vitals  Enc Vitals Group     BP 10/20/21 1022 135/79     Pulse Rate 10/20/21 1022 81     Resp 10/20/21 1022 18     Temp 10/20/21 1022 98.1 F (36.7 C)     Temp Source 10/20/21 1022 Oral     SpO2 10/20/21 1022 99 %     Weight --      Height --      Head Circumference --      Peak Flow --      Pain Score 10/20/21 1018 4     Pain Loc --      Pain Edu? --      Excl. in GC? --    No data found.  Updated Vital Signs BP 135/79 (BP Location: Right Arm)   Pulse 81   Temp 98.1 F (36.7 C) (Oral)   Resp 18   SpO2 99%  Breastfeeding Yes   Visual Acuity Right Eye Distance:   Left Eye Distance:   Bilateral Distance:    Right Eye Near:   Left Eye Near:    Bilateral Near:     Physical Exam Vitals and nursing note reviewed.  Constitutional:      Appearance: Normal appearance. She is not ill-appearing.  HENT:     Head: Atraumatic.  Eyes:     Extraocular Movements: Extraocular movements intact.     Conjunctiva/sclera: Conjunctivae normal.  Cardiovascular:     Rate and Rhythm: Normal rate and regular rhythm.     Heart sounds: Normal heart sounds.  Pulmonary:     Effort: Pulmonary effort is normal.     Breath sounds: Normal breath sounds.  Musculoskeletal:        General: Normal range of motion.     Cervical back: Normal range of motion and neck supple.  Skin:    General: Skin is warm.     Findings: Rash present.     Comments: Crusted raised lesions, some open ulceration  posterior scalp and scattered elsewhere on scalp.  Several posterior lymph nodes edematous, tender to palpation  Neurological:     Mental Status: She is alert and oriented to person, place, and time.  Psychiatric:        Mood and Affect: Mood normal.        Thought Content: Thought content normal.        Judgment: Judgment normal.     UC Treatments / Results  Labs (all labs ordered are listed, but only abnormal results are displayed) Labs Reviewed - No data to display  EKG   Radiology No results found.  Procedures Procedures (including critical care time)  Medications Ordered in UC Medications - No data to display  Initial Impression / Assessment and Plan / UC Course  I have reviewed the triage vital signs and the nursing notes.  Pertinent labs & imaging results that were available during my care of the patient were reviewed by me and considered in my medical decision making (see chart for details).     Do suspect seborrheic dermatitis versus fungal infection but now with secondary bacterial infection.  Will cover with Bactrim, prescribed ketoconazole cream as the shampoo burns too bad to use.  Referral to dermatology placed per her request for insurance purposes.  Follow-up for worsening symptoms in the meantime.  Final Clinical Impressions(s) / UC Diagnoses   Final diagnoses:  Blister of scalp with infection, initial encounter  Rash   Discharge Instructions   None    ED Prescriptions     Medication Sig Dispense Auth. Provider   ketoconazole (NIZORAL) 2 % cream Apply 1 application topically 2 (two) times daily as needed for irritation. 80 g Particia Nearing, [place]   sulfamethoxazole-trimethoprim (BACTRIM DS) 800-160 MG tablet Take 1 tablet by mouth 2 (two) times daily for 7 days. 14 tablet Particia Nearing, [place]      PDMP not reviewed this encounter.   Particia Nearing, [place] 10/20/21 1055

## 2021-11-29 ENCOUNTER — Ambulatory Visit
Admission: EM | Admit: 2021-11-29 | Discharge: 2021-11-29 | Disposition: A | Discharge status: Home / Self Care | Payer: Medicaid Other | Attending: Urgent Care | Admitting: Urgent Care

## 2021-11-29 ENCOUNTER — Other Ambulatory Visit: Payer: Self-pay

## 2021-11-29 ENCOUNTER — Encounter: Payer: Self-pay | Admitting: Emergency Medicine

## 2021-11-29 DIAGNOSIS — H65191 Other acute nonsuppurative otitis media, right ear: Secondary | ICD-10-CM

## 2021-11-29 DIAGNOSIS — H9201 Otalgia, right ear: Secondary | ICD-10-CM

## 2021-11-29 DIAGNOSIS — J3489 Other specified disorders of nose and nasal sinuses: Secondary | ICD-10-CM | POA: Diagnosis not present

## 2021-11-29 MED ORDER — FLUTICASONE PROPIONATE 50 MCG/ACT NA SUSP: 2.0000 | Freq: Every day | NASAL | 12 refills | Status: AC

## 2021-11-29 MED ORDER — CEFDINIR 300 MG PO CAPS: 300.0000 mg | ORAL_CAPSULE | Freq: Two times a day (BID) | ORAL | 0 refills | Status: DC

## 2021-11-29 NOTE — ED Triage Notes (Addendum)
Pt reports congestion, bilateral ear fullness, right ear pain, headache, and scalp/neck tenderness since end of december.  Pt reports was seen for scalp tenderness early December and reports several "abscess like spots" since being seen.

## 2021-11-29 NOTE — ED Provider Notes (Signed)
[place]   MRN: [id number] DOB: Mar 17, 1991  Subjective:   Chelsea Little is a 31 y.o. female presenting for 2-week history of persistent sinus congestion, bilateral ear fullness and pain mostly on the right side, sinus headaches.  She would also like a recheck on the scalp.  Has previously been seen for this in been advised that she has seborrhea dermatitis.  She was started on topical steroid creams and topical antifungal creams for the scalp.  Has been using this consistently without resolution.  She did have another office visit for the same and was prescribed an oral antibiotic.  Unfortunately the symptoms have progressed.  She has not been able to get in with dermatology practice but is trying to contact different practices that will take Medicaid.  No chest pain, shortness of breath or wheezing.  No current facility-administered medications for this encounter.  Current Outpatient Medications:    ketoconazole (NIZORAL) 2 % cream, Apply 1 application topically 2 (two) times daily as needed for irritation., Disp: 80 g, Rfl: 0   ketoconazole (NIZORAL) 2 % shampoo, Apply 1 application topically 2 (two) times daily., Disp: 300 mL, Rfl: 0   norethindrone (MICRONOR) 0.35 MG tablet, Take 1 tablet by mouth daily., Disp: , Rfl:    ondansetron (ZOFRAN ODT) 4 MG disintegrating tablet, Take 1 tablet (4 mg total) by mouth every 8 (eight) hours as needed for nausea or vomiting., Disp: 20 tablet, Rfl: 0   triamcinolone lotion (KENALOG) 0.1 %, Apply 1 application topically 2 (two) times daily., Disp: 60 mL, Rfl: 0   Allergies  Allergen Reactions   Topiramate Itching, Other (See Comments) and Rash    syncope syncope    Gabapentin Other (See Comments)    Unable to tolerate Unable to tolerate    Sumatriptan Hives    Migraine products Migraine products Migraine products Migraine products    Amoxicillin-Pot Clavulanate Diarrhea, Itching, Other (See Comments) and Rash    As a  child no problems with amoxil As a child no problems with amoxil    Rizatriptan Rash and Hives    Migraine products Migraine products Migraine products Migraine products     No past medical history on file.   Past Surgical History:  Procedure Laterality Date   CESAREAN SECTION     ORTHOPEDIC SURGERY     TONSILLECTOMY      No family history on file.  Social History   Tobacco Use   Smoking status: Never   Smokeless tobacco: Never  Substance Use Topics   Alcohol use: Not Currently   Drug use: Never    ROS   Objective:   Vitals: BP 120/80 (BP Location: Right Arm)    Pulse (!) 103    Temp 98.5 F (36.9 C) (Oral)    Resp 18    Ht 5\' 1"  (1.549 m)    Wt 152 lb (68.9 kg)    LMP 11/25/2021 (Exact Date)    SpO2 98%    Breastfeeding Yes    BMI 28.72 kg/m   Physical Exam Constitutional:      General: She is not in acute distress.    Appearance: Normal appearance. She is well-developed and normal weight. She is not ill-appearing, toxic-appearing or diaphoretic.  HENT:     Head: Normocephalic and atraumatic.     Comments: Posterior occipital scalp extending to either side has areas of dry scaly skin some nodular areas as well that are tender.  These areas vary in size and have a  regular shape from 1-3cm. There is 1/2cm of alopecia over the right posterior lower scalp.    Right Ear: Ear canal and external ear normal. No drainage or tenderness. No middle ear effusion. There is no impacted cerumen. Tympanic membrane is erythematous and bulging.     Left Ear: Tympanic membrane, ear canal and external ear normal. No drainage or tenderness.  No middle ear effusion. There is no impacted cerumen. Tympanic membrane is not erythematous.     Nose: Nose normal. No congestion or rhinorrhea.     Mouth/Throat:     Mouth: Mucous membranes are moist. No oral lesions.     Pharynx: No pharyngeal swelling, oropharyngeal exudate, posterior oropharyngeal erythema or uvula swelling.     Tonsils: No  tonsillar exudate or tonsillar abscesses.  Eyes:     General: No scleral icterus.       Right eye: No discharge.        Left eye: No discharge.     Extraocular Movements: Extraocular movements intact.     Right eye: Normal extraocular motion.     Left eye: Normal extraocular motion.     Conjunctiva/sclera: Conjunctivae normal.  Cardiovascular:     Rate and Rhythm: Normal rate.  Pulmonary:     Effort: Pulmonary effort is normal.  Musculoskeletal:     Cervical back: Normal range of motion and neck supple.  Lymphadenopathy:     Cervical: No cervical adenopathy.  Skin:    General: Skin is warm and dry.  Neurological:     General: No focal deficit present.     Mental Status: She is alert and oriented to person, place, and time.  Psychiatric:        Mood and Affect: Mood normal.        Behavior: Behavior normal.    Assessment and Plan :   PDMP not reviewed this encounter.  1. Other non-recurrent acute nonsuppurative otitis media of right ear   2. Right ear pain   3. Sinus pressure    Start cefdinir for otitis media of the right ear.  Recommend supportive care otherwise compatible with breast-feeding.  Regarding her scalp, patient will need to dermatology consult as our treatments have failed.  I provided her with a referral to [place] [facility] as well as other practices that may be able to see her.  Given timeline of her illness, deferred testing.  Counseled patient on potential for adverse effects with medications prescribed/recommended today, ER and return-to-clinic precautions discussed, patient verbalized understanding.    Jaynee Eagles, PA-C 11/29/21 1436

## 2022-03-26 ENCOUNTER — Other Ambulatory Visit (HOSPITAL_COMMUNITY): Payer: Self-pay | Admitting: Family Medicine

## 2022-03-26 DIAGNOSIS — N6320 Unspecified lump in the left breast, unspecified quadrant: Secondary | ICD-10-CM

## 2022-04-10 ENCOUNTER — Ambulatory Visit (HOSPITAL_COMMUNITY)
Admission: RE | Admit: 2022-04-10 | Discharge: 2022-04-10 | Disposition: A | Discharge status: Home / Self Care | Payer: Medicaid Other | Source: Ambulatory Visit | Attending: Family Medicine | Admitting: Family Medicine

## 2022-04-10 ENCOUNTER — Encounter (HOSPITAL_COMMUNITY): Payer: Self-pay | Admitting: Radiology

## 2022-04-10 DIAGNOSIS — N6320 Unspecified lump in the left breast, unspecified quadrant: Secondary | ICD-10-CM

## 2022-04-10 DIAGNOSIS — N6325 Unspecified lump in the left breast, overlapping quadrants: Secondary | ICD-10-CM | POA: Diagnosis not present

## 2022-04-11 ENCOUNTER — Telehealth: Payer: Medicaid Other | Admitting: Nurse Practitioner

## 2022-04-11 DIAGNOSIS — H00014 Hordeolum externum left upper eyelid: Secondary | ICD-10-CM | POA: Diagnosis not present

## 2022-04-11 MED ORDER — CEPHALEXIN 500 MG PO CAPS: 500.0000 mg | ORAL_CAPSULE | Freq: Three times a day (TID) | ORAL | 0 refills | Status: AC

## 2022-04-11 MED ORDER — ERYTHROMYCIN 5 MG/GM OP OINT: 1.0000 "application " | TOPICAL_OINTMENT | Freq: Every day | OPHTHALMIC | 0 refills | Status: AC

## 2022-04-11 NOTE — Patient Instructions (Signed)
Stye ?A stye, also known as a hordeolum, is a bump that forms on an eyelid. It may look like a pimple next to the eyelash. A stye can form inside the eyelid (internal stye) or outside the eyelid (external stye). A stye can cause redness, swelling, and pain on the eyelid. ?Styes are very common. Anyone can get them at any age. They usually occur in just one eye at a time, but you may have more than one in either eye. ?What are the causes? ?A stye is caused by an infection. The infection is almost always caused by bacteria called Staphylococcus aureus. This is a common type of bacteria that lives on the skin. ?An internal stye may result from an infected oil-producing gland inside the eyelid. An external stye may be caused by an infection at the base of the eyelash (hair follicle). ?What increases the risk? ?You are more likely to develop a stye if: ?You have had a stye before. ?You have any of these conditions: ?Red, itchy, inflamed eyelids (blepharitis). ?A skin condition such as seborrheic dermatitis or rosacea. ?High fat levels in your blood (lipids). ?Dry eyes. ?What are the signs or symptoms? ?The most common symptom of a stye is eyelid pain. Internal styes are more painful than external styes. Other symptoms may include: ?Painful swelling of your eyelid. ?A scratchy feeling in your eye. ?Tearing and redness of your eye. ?A pimple-like bump on the edge of the eyelid. ?Pus draining from the stye. ?How is this diagnosed? ?Your health care provider may be able to diagnose a stye just by examining your eye. The health care provider may also check to make sure: ?You do not have a fever or other signs of a more serious infection. ?The infection has not spread to other parts of your eye or areas around your eye. ?How is this treated? ?Most styes will clear up in a few days without treatment or with warm compresses applied to the area. You may need to use antibiotic drops or ointment to treat an infection. Sometimes,  steroid drops or ointment are used in addition to antibiotics. ?In some cases, your health care provider may give you a small steroid injection in the eyelid. ?If your stye does not heal with routine treatment, your health care provider may drain pus from the stye using a thin blade or needle. This may be done if the stye is large, causing a lot of pain, or affecting your vision. ?Follow these instructions at home: ?Take over-the-counter and prescription medicines only as told by your health care provider. This includes eye drops or ointments. ?If you were prescribed an antibiotic medicine, steroid medicine, or both, apply or use them as told by your health care provider. Do not stop using the medicine even if your condition improves. ?Apply a warm, wet cloth (warm compress) to your eye for 5-10 minutes, 4 to 6 times a day. ?Clean the affected eyelid as directed by your health care provider. ?Do not wear contact lenses or eye makeup until your stye has healed and your health care provider says that it is safe. ?Do not try to pop or drain the stye. ?Do not rub your eye. ?Contact a health care provider if: ?You have chills or a fever. ?Your stye does not go away after several days. ?Your stye affects your vision. ?Your eyeball becomes swollen, red, or painful. ?Get help right away if: ?You have pain when moving your eye around. ?Summary ?A stye is a bump that forms   on an eyelid. It may look like a pimple next to the eyelash. ?A stye can form inside the eyelid (internal stye) or outside the eyelid (external stye). A stye can cause redness, swelling, and pain on the eyelid. ?Your health care provider may be able to diagnose a stye just by examining your eye. ?Apply a warm, wet cloth (warm compress) to your eye for 5-10 minutes, 4 to 6 times a day. ?This information is not intended to replace advice given to you by your health care provider. Make sure you discuss any questions you have with your health care  provider. ?Document Revised: 01/11/2021 Document Reviewed: 01/11/2021 ?Elsevier Patient Education ? 2023 Elsevier Inc[employer] ? ?

## 2022-04-11 NOTE — Progress Notes (Signed)
Virtual Visit Consent   Chelsea Little, you are scheduled for a virtual visit with Mary-Margaret Daphine Deutscher, FNP, a [facility] provider, today.     Just as with appointments in the office, your consent must be obtained to participate.  Your consent will be active for this visit and any virtual visit you may have with one of our providers in the next 365 days.     If you have a MyChart account, a copy of this consent can be sent to you electronically.  All virtual visits are billed to your insurance company just like a traditional visit in the office.    As this is a virtual visit, video technology does not allow for your provider to perform a traditional examination.  This may limit your provider's ability to fully assess your condition.  If your provider identifies any concerns that need to be evaluated in person or the need to arrange testing (such as labs, EKG, etc.), we will make arrangements to do so.     Although advances in technology are sophisticated, we cannot ensure that it will always work on either your end or our end.  If the connection with a video visit is poor, the visit may have to be switched to a telephone visit.  With either a video or telephone visit, we are not always able to ensure that we have a secure connection.     I need to obtain your verbal consent now.   Are you willing to proceed with your visit today? YES   Ashlyn Geerdes has provided verbal consent on 04/11/2022 for a virtual visit (video or telephone).   Mary-Margaret Daphine Deutscher, FNP   Date: 04/11/2022 10:20 AM   Virtual Visit via Video Note   I, Mary-Margaret Andera Cranmer, connected with Chelsea Little ([id number], 23-Mar-1991) on 04/11/22 at 10:30 AM EDT by a video-enabled telemedicine application and verified that I am speaking with the correct person using two identifiers.  Location: Patient: Virtual Visit Location Patient: Home Provider: Virtual Visit Location Provider: Mobile   I discussed the limitations of  evaluation and management by telemedicine and the availability of in person appointments. The patient expressed understanding and agreed to proceed.    History of Present Illness: Chelsea Little is a 31 y.o. who identifies as a female who was assigned female at birth, and is being seen today for eyelid infection.  HPI: Patient states that her left upper lid started getting red a swollen on Monday. Has gradually worsened. Very tender to touch, but no drainage or visual issues. She has used cool compresses with no relief.   Review of Systems  Constitutional:  Negative for diaphoresis and weight loss.  Eyes:  Negative for blurred vision, double vision, photophobia, pain, discharge and redness.  Respiratory:  Negative for shortness of breath.   Cardiovascular:  Negative for chest pain, palpitations, orthopnea and leg swelling.  Gastrointestinal:  Negative for abdominal pain.  Skin:  Negative for rash.  Neurological:  Negative for dizziness, sensory change, loss of consciousness, weakness and headaches.  Endo/Heme/Allergies:  Negative for polydipsia. Does not bruise/bleed easily.  Psychiatric/Behavioral:  Negative for memory loss. The patient does not have insomnia.   All other systems reviewed and are negative.Problems: There are no problems to display for this patient.   Allergies:  Allergies  Allergen Reactions   Topiramate Itching, Other (See Comments) and Rash    syncope syncope    Gabapentin Other (See Comments)    Didn't work for what was prescribed.  Sumatriptan Hives    Migraine products Migraine products Migraine products Migraine products    Amoxicillin-Pot Clavulanate Diarrhea, Itching, Other (See Comments) and Rash    As a child no problems with amoxil As a child no problems with amoxil    Rizatriptan Rash and Hives    Migraine products Migraine products Migraine products Migraine products    Medications:  Current Outpatient Medications:    cephALEXin  (KEFLEX) 500 MG capsule, Take 1 capsule (500 mg total) by mouth 3 (three) times daily., Disp: 21 capsule, Rfl: 0   erythromycin ophthalmic ointment, Place 1 application. into the left eye at bedtime., Disp: 3.5 g, Rfl: 0   fluticasone (FLONASE) 50 MCG/ACT nasal spray, Place 2 sprays into both nostrils daily., Disp: 16 g, Rfl: 12   ketoconazole (NIZORAL) 2 % cream, Apply 1 application topically 2 (two) times daily as needed for irritation., Disp: 80 g, Rfl: 0   ketoconazole (NIZORAL) 2 % shampoo, Apply 1 application topically 2 (two) times daily., Disp: 300 mL, Rfl: 0   norethindrone (MICRONOR) 0.35 MG tablet, Take 1 tablet by mouth daily., Disp: , Rfl:    ondansetron (ZOFRAN ODT) 4 MG disintegrating tablet, Take 1 tablet (4 mg total) by mouth every 8 (eight) hours as needed for nausea or vomiting., Disp: 20 tablet, Rfl: 0   triamcinolone lotion (KENALOG) 0.1 %, Apply 1 application topically 2 (two) times daily., Disp: 60 mL, Rfl: 0  Observations/Objective: Patient is well-developed, well-nourished in no acute distress.  Resting comfortably  at home.  Head is normocephalic, atraumatic.  No labored breathing.  Speech is clear and coherent with logical content.  Patient is alert and oriented at baseline.  Left upper lid erythematous and edematous.  Assessment and Plan:  Chelsea Little in today with chief complaint of eye lid infection   1. Hordeolum externum of left upper eyelid Warm compresses Avoid rubbing eye  Meds ordered this encounter  Medications   cephALEXin (KEFLEX) 500 MG capsule    Sig: Take 1 capsule (500 mg total) by mouth 3 (three) times daily.    Dispense:  21 capsule    Refill:  0    Order Specific Question:   Supervising Provider    Answer:   MILLER, BRIAN [[id number]]   erythromycin ophthalmic ointment    Sig: Place 1 application. into the left eye at bedtime.    Dispense:  3.5 g    Refill:  0    Order Specific Question:   Supervising Provider    Answer:   Eber Hong [[id number]]     Follow Up Instructions: I discussed the assessment and treatment plan with the patient. The patient was provided an opportunity to ask questions and all were answered. The patient agreed with the plan and demonstrated an understanding of the instructions.  A copy of instructions were sent to the patient via MyChart.  The patient was advised to call back or seek an in-person evaluation if the symptoms worsen or if the condition fails to improve as anticipated.  Time:  I spent 8 minutes with the patient via telehealth technology discussing the above problems/concerns.    Mary-Margaret Daphine Deutscher, FNP

## 2022-11-26 IMAGING — MG DIGITAL DIAGNOSTIC BILAT W/ TOMO W/ CAD
6 of 10 series · 6 of 30 positions shown · non-contrast
Comparison: None.

CLINICAL DATA: 31-year-old female with a left breast palpable area
of concern and associated small red area on the skin of the outer
left breast. Patient is currently breast feeding.

EXAM:
DIGITAL DIAGNOSTIC BILATERAL MAMMOGRAM WITH TOMOSYNTHESIS AND CAD;
ULTRASOUND LEFT BREAST LIMITED
TECHNIQUE: Bilateral digital diagnostic mammography and breast tomosynthesis
was performed. The images were evaluated with computer-aided
detection.; Targeted ultrasound examination of the left breast was
performed.

[L CC synth-2D (1 of 2)]
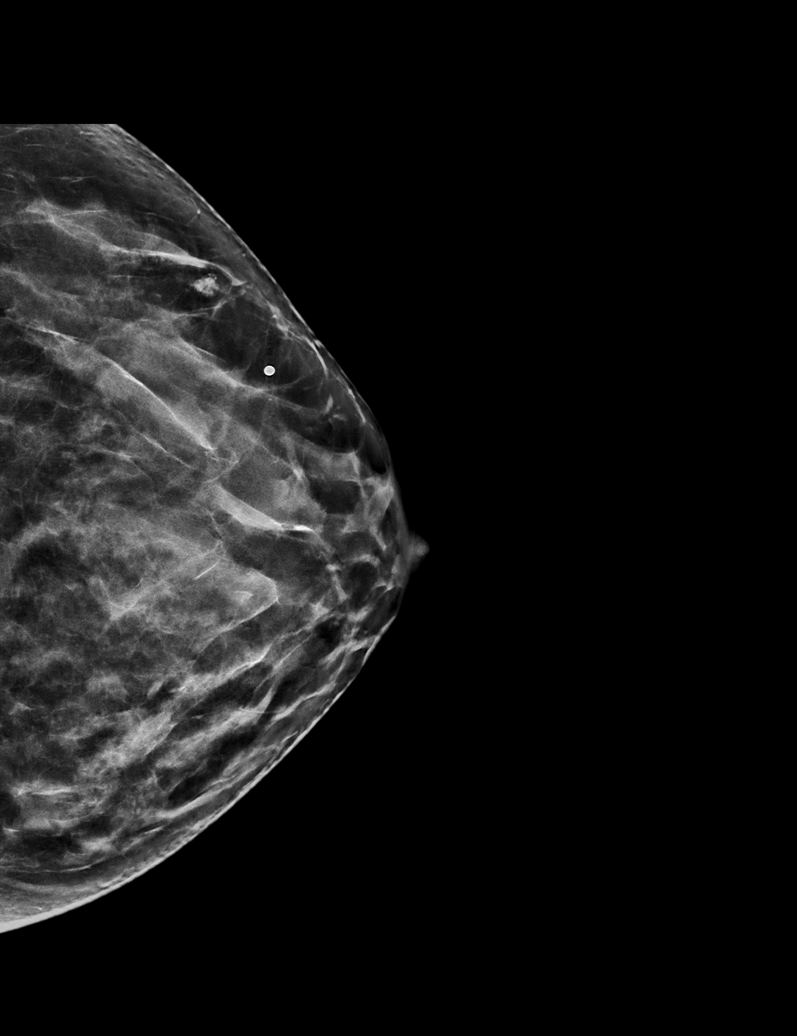

[R CC synth-2D]
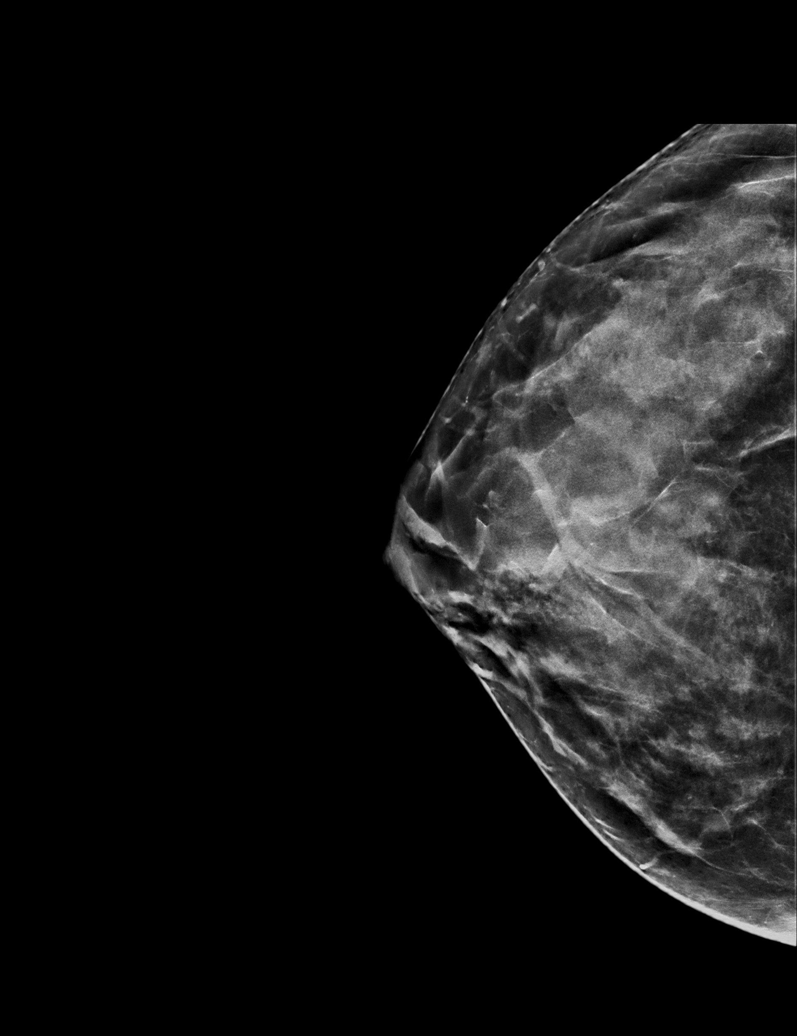

[L CC synth-2D (2 of 2)]
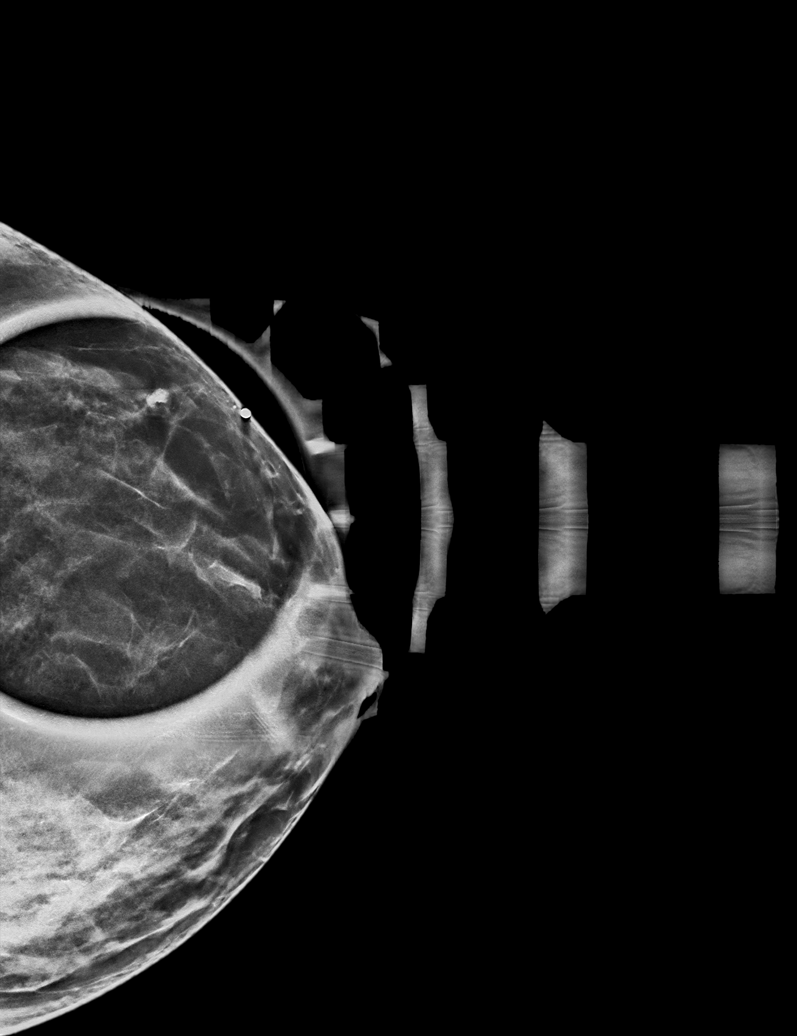

[L MLO synth-2D]
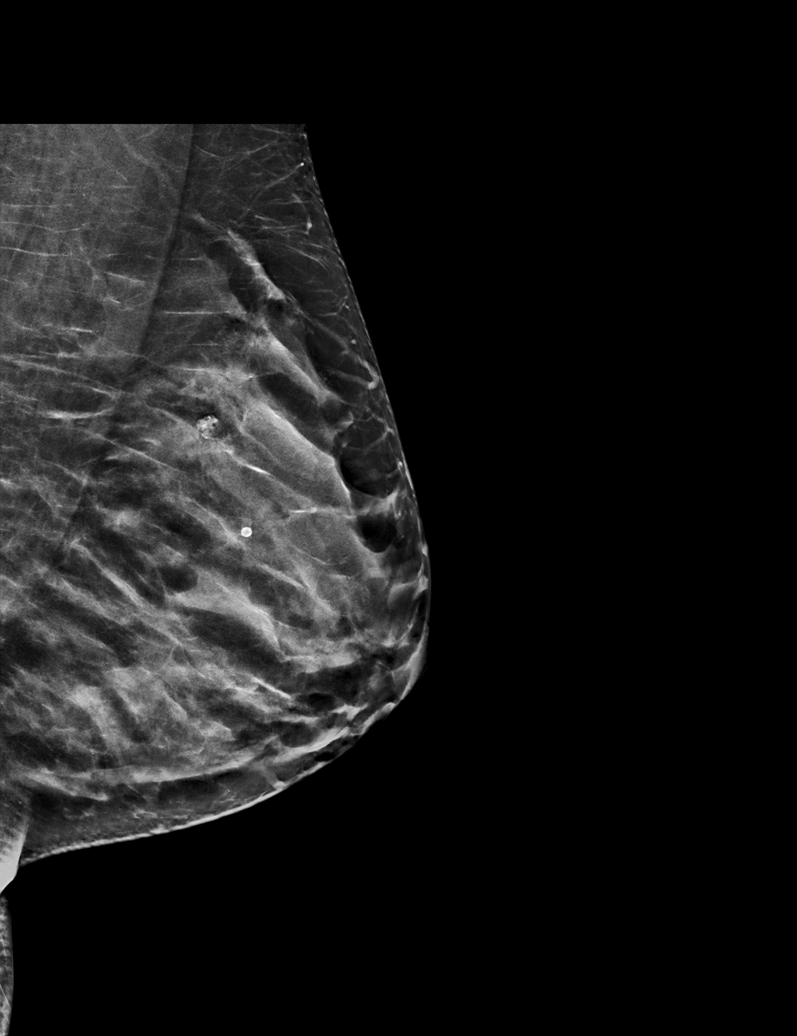

[R MLO synth-2D]
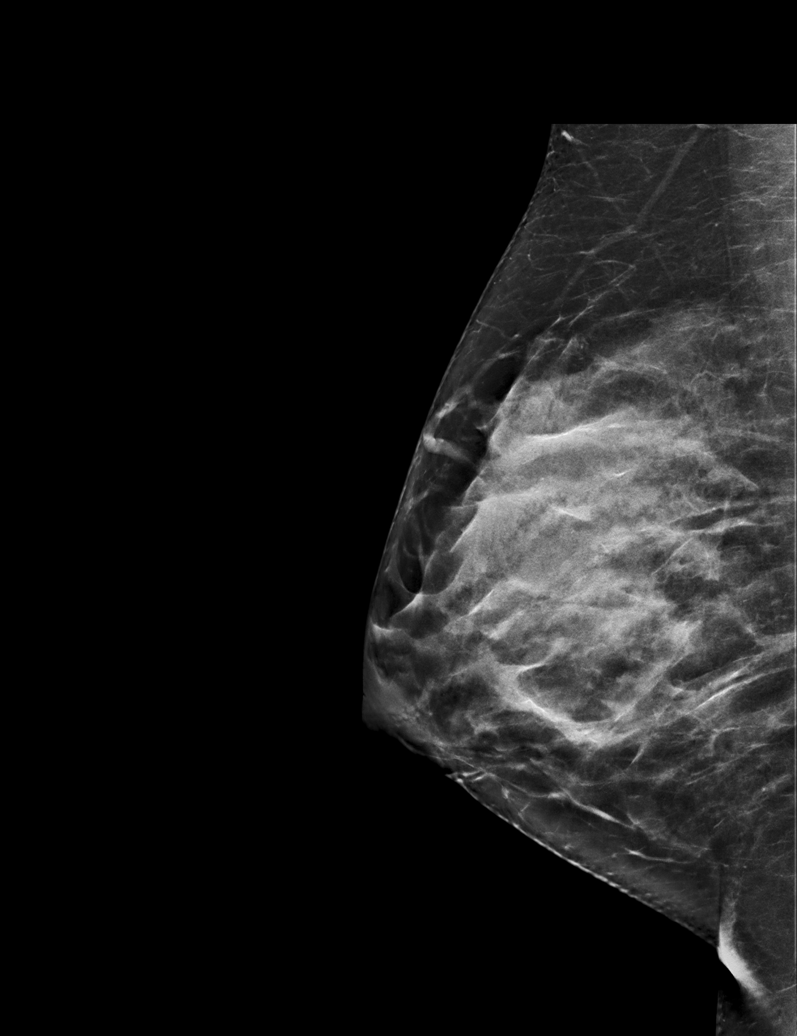

[R MLO tomo · tomo slice 36/71.0]
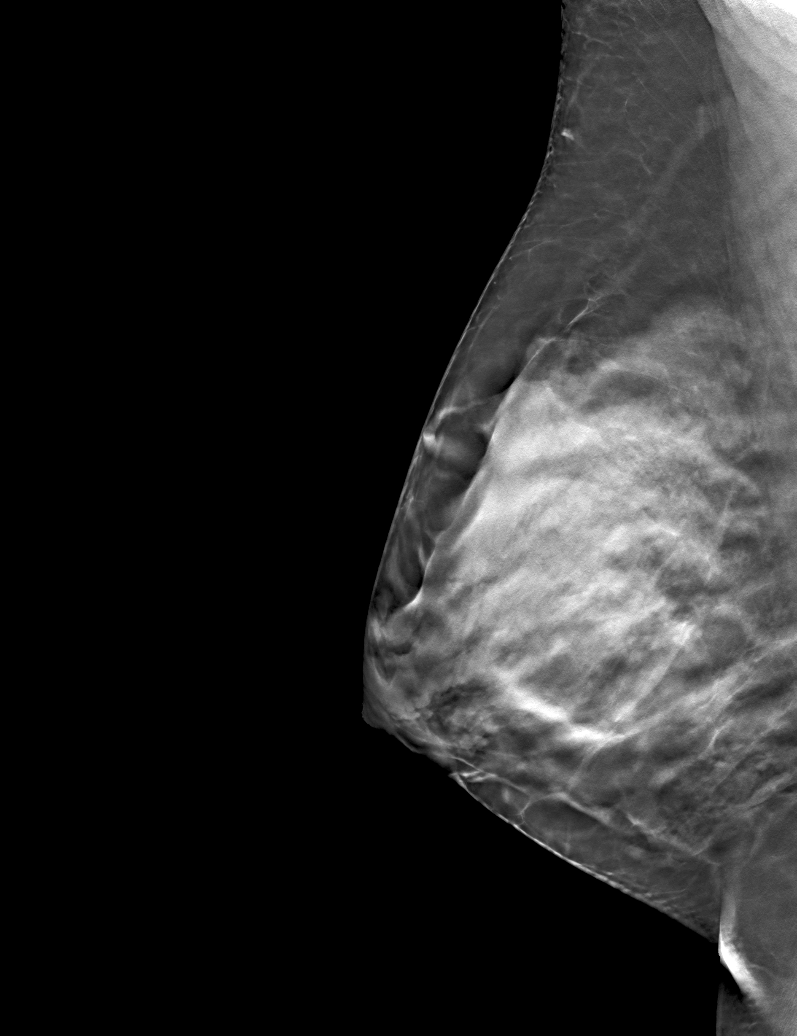

[6 of 30 positions shown; findings below may reference images not displayed]

ACR Breast Density Category d: The breast tissue is extremely dense,
which lowers the sensitivity of mammography.
FINDINGS: No suspicious masses or calcifications seen in the right breast.
Spot compression tangential tomograms were performed over the
palpable area of concern in the left breast. There is a small oval
mass containing high density measuring approximately 0.5 cm with
findings suggestive of an intramammary lymph node.

Physical examination of the outer left breast reveals a small oval
area brownish/red skin discoloration at the approximate 3 o'clock
position. There are no underlying palpable masses. The patient does
have multiple tattoos with a large tattoo on her torso extending to
the left inframammary fold.

Targeted ultrasound of the left breast was performed. There is a
questionable tiny vague hypoechoic area in the skin of the left
breast at 3 o'clock 2 cm from nipple measuring 0.4 cm. This may
represent a tiny sebaceous cyst or resolved area of infection. An
intramammary lymph node is seen in the left breast at 3 o'clock 2 cm
from nipple measuring 0.5 cm and containing foci of high density.
This corresponds well with the mass seen in the left breast at
mammography with high density most consistent with tattoo pigment.
IMPRESSION: 1. No mammographic or sonographic abnormalities the site of palpable
concern in the outer left breast. Tiny hypoechoic foci in the skin
of the outer left breast may represent a sebaceous cyst or resolved
area of infection.

2.  No findings of malignancy in either breast.

RECOMMENDATION:
Screening mammogram at age 41 unless there are persistent or
intervening clinical concerns. (Code:J9-T-RH8)

I have discussed the findings and recommendations with the patient.
If applicable, a reminder letter will be sent to the patient
regarding the next appointment.

BI-RADS CATEGORY  2: Benign.

## 2024-05-13 ENCOUNTER — Encounter (HOSPITAL_COMMUNITY): Payer: Self-pay

## 2024-05-13 ENCOUNTER — Other Ambulatory Visit: Payer: Self-pay

## 2024-05-13 ENCOUNTER — Emergency Department (HOSPITAL_COMMUNITY)
Admission: EM | Admit: 2024-05-13 | Discharge: 2024-05-13 | Disposition: A | Discharge status: Home / Self Care | Attending: Emergency Medicine | Admitting: Emergency Medicine

## 2024-05-13 ENCOUNTER — Emergency Department (HOSPITAL_COMMUNITY)

## 2024-05-13 DIAGNOSIS — N132 Hydronephrosis with renal and ureteral calculous obstruction: Secondary | ICD-10-CM | POA: Diagnosis not present

## 2024-05-13 DIAGNOSIS — N200 Calculus of kidney: Secondary | ICD-10-CM

## 2024-05-13 DIAGNOSIS — R1031 Right lower quadrant pain: Secondary | ICD-10-CM | POA: Diagnosis present

## 2024-05-13 HISTORY — DX: Disorder of kidney and ureter, unspecified: N28.9

## 2024-05-13 LAB — CBC WITH DIFFERENTIAL/PLATELET
Abs Immature Granulocytes: 0.04 10*3/uL (ref 0.00–0.07)
Basophils Absolute: 0 10*3/uL (ref 0.0–0.1)
Basophils Relative: 0 %
Eosinophils Absolute: 0.1 10*3/uL (ref 0.0–0.5)
Eosinophils Relative: 1 %
HCT: 38.4 % (ref 36.0–46.0)
Hemoglobin: 13.6 g/dL (ref 12.0–15.0)
Immature Granulocytes: 0 %
Lymphocytes Relative: 12 %
Lymphs Abs: 1.2 10*3/uL (ref 0.7–4.0)
MCH: 31.2 pg (ref 26.0–34.0)
MCHC: 35.4 g/dL (ref 30.0–36.0)
MCV: 88.1 fL (ref 80.0–100.0)
Monocytes Absolute: 0.7 10*3/uL (ref 0.1–1.0)
Monocytes Relative: 6 %
Neutro Abs: 8.5 10*3/uL — ABNORMAL HIGH (ref 1.7–7.7)
Neutrophils Relative %: 81 %
Platelets: 299 10*3/uL (ref 150–400)
RBC: 4.36 MIL/uL (ref 3.87–5.11)
RDW: 12.6 % (ref 11.5–15.5)
WBC: 10.6 10*3/uL — ABNORMAL HIGH (ref 4.0–10.5)
nRBC: 0 % (ref 0.0–0.2)

## 2024-05-13 LAB — LIPASE, BLOOD: Lipase: 33 U/L (ref 11–51)

## 2024-05-13 LAB — URINALYSIS, ROUTINE W REFLEX MICROSCOPIC
Bilirubin Urine: NEGATIVE
Glucose, UA: NEGATIVE mg/dL
Ketones, ur: 20 mg/dL — AB
Nitrite: NEGATIVE
Protein, ur: 100 mg/dL — AB
RBC / HPF: >50 RBC/hpf (ref 0–5)
Specific Gravity, Urine: 1.018 (ref 1.005–1.030)
WBC, UA: >50 WBC/hpf (ref 0–5)
pH: 8 (ref 5.0–8.0)

## 2024-05-13 LAB — COMPREHENSIVE METABOLIC PANEL WITH GFR
ALT: 25 U/L (ref 0–44)
AST: 21 U/L (ref 15–41)
Albumin: 4.4 g/dL (ref 3.5–5.0)
Alkaline Phosphatase: 59 U/L (ref 38–126)
Anion gap: 10 (ref 5–15)
BUN: 14 mg/dL (ref 6–20)
CO2: 22 mmol/L (ref 22–32)
Calcium: 9.3 mg/dL (ref 8.9–10.3)
Chloride: 106 mmol/L (ref 98–111)
Creatinine, Ser: 0.64 mg/dL (ref 0.44–1.00)
GFR, Estimated: >60 mL/min (ref >60)
Glucose, Bld: 94 mg/dL (ref 70–99)
Potassium: 3.8 mmol/L (ref 3.5–5.1)
Sodium: 138 mmol/L (ref 135–145)
Total Bilirubin: 0.7 mg/dL (ref 0.0–1.2)
Total Protein: 7.8 g/dL (ref 6.5–8.1)

## 2024-05-13 LAB — PREGNANCY, URINE: Preg Test, Ur: NEGATIVE

## 2024-05-13 MED ORDER — OXYCODONE HCL 5 MG PO TABS: 5.0000 mg | ORAL_TABLET | ORAL | 0 refills | Status: DC | PRN

## 2024-05-13 MED ORDER — ONDANSETRON HCL 4 MG/2ML IJ SOLN
4.0000 mg | Freq: Once | INTRAMUSCULAR | Status: AC
  Administered 2024-05-13: 4 mg via INTRAVENOUS
  Filled 2024-05-13: qty 2

## 2024-05-13 MED ORDER — HYDROMORPHONE HCL 1 MG/ML IJ SOLN
0.5000 mg | Freq: Once | INTRAMUSCULAR | Status: AC
  Administered 2024-05-13: 0.5 mg via INTRAVENOUS
  Filled 2024-05-13: qty 0.5

## 2024-05-13 MED ORDER — TAMSULOSIN HCL 0.4 MG PO CAPS: 0.4000 mg | ORAL_CAPSULE | Freq: Every day | ORAL | 0 refills | Status: DC

## 2024-05-13 MED ORDER — IOHEXOL 300 MG/ML  SOLN
100.0000 mL | Freq: Once | INTRAMUSCULAR | Status: AC | PRN
  Administered 2024-05-13: 100 mL via INTRAVENOUS

## 2024-05-13 MED ORDER — KETOROLAC TROMETHAMINE 15 MG/ML IJ SOLN
15.0000 mg | Freq: Once | INTRAMUSCULAR | Status: AC
  Administered 2024-05-13: 15 mg via INTRAVENOUS
  Filled 2024-05-13: qty 1

## 2024-05-13 MED ORDER — ONDANSETRON 4 MG PO TBDP: 4.0000 mg | ORAL_TABLET | Freq: Three times a day (TID) | ORAL | 0 refills | Status: DC | PRN

## 2024-05-13 NOTE — ED Triage Notes (Signed)
 Pt arrived via POV c/o severe RLQ abdominal pain that began this morning. Pt endorses nausea.

## 2024-05-13 NOTE — ED Provider Notes (Signed)
 [place] EMERGENCY DEPARTMENT AT [facility] Provider Note   CSN: [id number] Arrival date & time: 05/13/24  1140     Patient presents with: Abdominal Pain   Chelsea Little is a 33 y.o. female.   33 year old female with history of kidney stone who presents emergency department right lower quadrant pain.  Patient reports that this morning at 9 AM she woke up and started having sharp right lower quadrant pain.  Does radiate to her back.  Also developed sweats and nausea.  No vomiting.  No diarrhea or constipation.  No vaginal bleeding or discharge.  Does still have her appendix.  No history of ovarian cyst that she is aware of.       Prior to Admission medications   Medication Sig Start Date End Date Taking? Authorizing Provider  ondansetron  (ZOFRAN -ODT) 4 MG disintegrating tablet Take 1 tablet (4 mg total) by mouth every 8 (eight) hours as needed for nausea or vomiting. 05/13/24  Yes Yolande Lamar BROCKS, MD  oxyCODONE (ROXICODONE) 5 MG immediate release tablet Take 1 tablet (5 mg total) by mouth every 4 (four) hours as needed for severe pain (pain score 7-10). 05/13/24  Yes Yolande Lamar BROCKS, MD  tamsulosin [facility]) 0.4 MG CAPS capsule Take 1 capsule (0.4 mg total) by mouth daily after breakfast. 05/13/24  Yes Yolande Lamar BROCKS, MD  cephALEXin  (KEFLEX ) 500 MG capsule Take 1 capsule (500 mg total) by mouth 3 (three) times daily. 04/11/22   Gladis Mustard, FNP  erythromycin  ophthalmic ointment Place 1 application. into the left eye at bedtime. 04/11/22   Gladis Mary-Margaret, FNP  fluticasone  (FLONASE ) 50 MCG/ACT nasal spray Place 2 sprays into both nostrils daily. 11/29/21   Christopher Savannah, PA-C  ketoconazole  (NIZORAL ) 2 % cream Apply 1 application topically 2 (two) times daily as needed for irritation. 10/20/21   Stuart Vernell Norris, PA-C  ketoconazole  (NIZORAL ) 2 % shampoo Apply 1 application topically 2 (two) times daily. 09/25/21   Christopher Savannah, PA-C  norethindrone  (MICRONOR) 0.35 MG tablet Take 1 tablet by mouth daily. 05/20/21   [provider]  triamcinolone  lotion (KENALOG ) 0.1 % Apply 1 application topically 2 (two) times daily. 09/25/21   Christopher Savannah, PA-C    Allergies: Topiramate, Gabapentin, Sumatriptan, Amoxicillin-pot clavulanate, and Rizatriptan    Review of Systems  Updated Vital Signs BP 119/78 (BP Location: Right Arm)   Pulse (!) 102   Temp 98.2 F (36.8 C) (Oral)   Resp 16   Ht 5' 1 (1.549 m)   Wt 68.9 kg   LMP 04/18/2024 (Exact Date)   SpO2 99%   BMI 28.70 kg/m   Physical Exam Vitals and nursing note reviewed.  Constitutional:      General: She is not in acute distress.    Appearance: She is well-developed.     Comments: Uncomfortable appearing in the fetal position on the stretcher.  HENT:     Head: Normocephalic and atraumatic.     Right Ear: External ear normal.     Left Ear: External ear normal.     Nose: Nose normal.   Eyes:     Extraocular Movements: Extraocular movements intact.     Conjunctiva/sclera: Conjunctivae normal.     Pupils: Pupils are equal, round, and reactive to light.   Pulmonary:     Effort: No respiratory distress.  Abdominal:     General: Abdomen is flat. There is no distension.     Palpations: Abdomen is soft. There is no mass.  Tenderness: There is abdominal tenderness (Right lower quadrant tenderness palpation). There is no right CVA tenderness, left CVA tenderness or guarding.   Skin:    General: Skin is warm and dry.   Neurological:     Mental Status: She is alert and oriented to person, place, and time. Mental status is at baseline.   Psychiatric:        Mood and Affect: Mood normal.     (all labs ordered are listed, but only abnormal results are displayed) Labs Reviewed  URINALYSIS, ROUTINE W REFLEX MICROSCOPIC - Abnormal; Notable for the following components:      Result Value   APPearance HAZY (*)    Hgb urine dipstick SMALL (*)    Ketones, ur 20 (*)     Protein, ur 100 (*)    Leukocytes,Ua MODERATE (*)    Bacteria, UA RARE (*)    All other components within normal limits  CBC WITH DIFFERENTIAL/PLATELET - Abnormal; Notable for the following components:   WBC 10.6 (*)    Neutro Abs 8.5 (*)    All other components within normal limits  LIPASE, BLOOD  COMPREHENSIVE METABOLIC PANEL WITH GFR  PREGNANCY, URINE    EKG: None  Radiology: CT ABDOMEN PELVIS W CONTRAST Result Date: 05/13/2024 EXAM:  CT ABDOMEN PELVIS WITH IV CONTRAST INDICATION:  R lower quadrant pain, hx of kidney stones TECHNIQUE: Spiral CT scanning was performed through the abdomen and pelvis after the patient received IV contrast. FINDINGS: There is no significant abnormality identified in the lung bases, liver, spleen, pancreas and gallbladder. A 5 mm calculus is present at the right ureteropelvic junction. Mild right hydronephrosis is present. There is abnormal enhancement of the right renal pelvic uroepithelium. No calculus or obstruction is present on the left. There are small renal cysts which do not require imaging follow-up. There are no adrenal masses. Moderate stool retention is present. There is no significant bowel dilatation. There is no evidence of ascites or adenopathy. No abdominal aortic aneurysm is present. The bladder, uterus and adnexa have a normal appearance. There is no inguinal hernia. The pelvic bowel loops have a normal appearance. The appendix has a normal appearance. There is no fracture or bone destruction. IMPRESSION: 1. 5 mm obstructing calculus at the right ureteropelvic junction. 2. Abnormal enhancement of the right renal pelvic uroepithelium, consistent with ureteritis. 3. Moderate stool retention, consistent with constipation. 4. Normal appendix. Please note that CT scanning at this site utilizes multiple dose reduction techniques, including automatic exposure control, adjustment of the MAA and/or KVP according to the patient's size, and use of iterative  reconstruction. Electronically signed by: Eddy Oar MD 05/13/2024 01:52 PM EDT RP Workstation: [id number]     Procedures   Medications Ordered in the ED  HYDROmorphone (DILAUDID) injection 0.5 mg (0.5 mg Intravenous Given 05/13/24 1214)  ondansetron  (ZOFRAN ) injection 4 mg (4 mg Intravenous Given 05/13/24 1213)  iohexol (OMNIPAQUE) 300 MG/ML solution 100 mL (100 mLs Intravenous Contrast Given 05/13/24 1319)  ketorolac  (TORADOL ) 15 MG/ML injection 15 mg (15 mg Intravenous Given 05/13/24 1407)                                    Medical Decision Making Amount and/or Complexity of Data Reviewed Labs: ordered. Radiology: ordered.  Risk Prescription drug management.   Chelsea Little is a 33 y.o. female with comorbidities that complicate the patient evaluation including history of kidney stones who  presents emergency department with right lower quadrant abdominal pain  Initial Ddx:  Appendicitis, ovarian torsion, nephrolithiasis, pyelonephritis, lumbar radiculopathy, AAA  MDM:  Patient presents emergency department with right lower quadrant abdominal pain.  Does have a history of kidney stones.  On exam does have some mild right lower quadrant tenderness to palpation.  Concerned about appendicitis or ovarian torsion.  Nephrolithiasis also high up on the differential.  Will obtain urinalysis to assess for pyelonephritis as well but feel this is less likely given the lack of systemic symptoms.  Given age will also assess for AAA with CT scan. No symptoms that would be suggestive of radiculopathy.  Plan:  Labs Urinalysis CT abdomen pelvis with contrast  pain medication  ED Summary/Re-evaluation:  CT scan did show a distal 5 mm stone in the right ureter.  Based on patient's urinalysis and the fact that she is not having dysuria or frequency do not feel that there is any infection underlying it.  No AKI on lab work.  Patient's pain was able to be controlled and they were given a urine  strainer and instructions on how to use it.  Will have them follow-up with urology for additional evaluation.  Was sent home with instructions to take Tylenol, ibuprofen, Flomax, and oxycodone for any breakthrough pain.  This patient presents to the ED for concern of complaints listed in HPI, this involves an extensive number of treatment options, and is a complaint that carries with it a high risk of complications and morbidity. Disposition including potential need for admission considered.   Dispo: DC Home. Return precautions discussed including, but not limited to, those listed in the AVS. Allowed pt time to ask questions which were answered fully prior to dc.  Additional history obtained from spouse Records reviewed Outpatient Clinic Notes The following labs were independently interpreted: Chemistry and show no acute abnormality I independently reviewed the following imaging with scope of interpretation limited to determining acute life threatening conditions related to emergency care: CT Abdomen/Pelvis and agree with the radiologist interpretation with the following exceptions: none I personally reviewed and interpreted cardiac monitoring: normal sinus rhythm  I personally reviewed and interpreted the pt's EKG: see above for interpretation  I have reviewed the patients home medications and made adjustments as needed   Final diagnoses:  Right kidney stone    ED Discharge Orders          Ordered    tamsulosin (FLOMAX) 0.4 MG CAPS capsule  Daily after breakfast        05/13/24 1410    oxyCODONE (ROXICODONE) 5 MG immediate release tablet  Every 4 hours PRN        05/13/24 1410    ondansetron  (ZOFRAN -ODT) 4 MG disintegrating tablet  Every 8 hours PRN        05/13/24 1410               Yolande Lamar BROCKS, MD 05/13/24 [phone]

## 2024-05-13 NOTE — ED Notes (Signed)
 Pt returned from CT

## 2024-05-13 NOTE — Discharge Instructions (Signed)
 You were seen for your kidney stone in the emergency department.    At home, please take Tylenol and ibuprofen for your pain. You may also take the oxycodone we have prescribed you for any breakthrough pain that may have.  Do not take this before driving or operating heavy machinery.  Do not take this medication with alcohol.  Use the flowmax we have give you every day until the kidney stone passes. Please also strain your urine to collect the kidney stone. Store it in a container and take it to your urologist for analysis.   Follow-up with urology in a week to discuss your symptoms.   Return immediately to the emergency department if you experience any of the following: fever, unbearable pain, urinary retention, or any other concerning symptoms.    Thank you for visiting our Emergency Department. It was a pleasure taking care of you today.

## 2024-05-20 ENCOUNTER — Encounter: Payer: Self-pay | Admitting: Urology

## 2024-05-20 ENCOUNTER — Ambulatory Visit (HOSPITAL_COMMUNITY)
Admission: RE | Admit: 2024-05-20 | Discharge: 2024-05-20 | Disposition: A | Discharge status: Home / Self Care | Source: Ambulatory Visit | Attending: Urology | Admitting: Urology

## 2024-05-20 ENCOUNTER — Other Ambulatory Visit (HOSPITAL_COMMUNITY)
Admission: RE | Admit: 2024-05-20 | Discharge: 2024-05-20 | Disposition: A | Discharge status: Home / Self Care | Source: Ambulatory Visit | Attending: Urology | Admitting: Urology

## 2024-05-20 ENCOUNTER — Ambulatory Visit (INDEPENDENT_AMBULATORY_CARE_PROVIDER_SITE_OTHER): Admitting: Urology

## 2024-05-20 VITALS — BP 77/51 | HR 67

## 2024-05-20 DIAGNOSIS — N200 Calculus of kidney: Secondary | ICD-10-CM | POA: Insufficient documentation

## 2024-05-20 DIAGNOSIS — N201 Calculus of ureter: Secondary | ICD-10-CM

## 2024-05-20 DIAGNOSIS — R110 Nausea: Secondary | ICD-10-CM

## 2024-05-20 DIAGNOSIS — R42 Dizziness and giddiness: Secondary | ICD-10-CM | POA: Diagnosis not present

## 2024-05-20 LAB — CBC
HCT: 37.8 % (ref 36.0–46.0)
Hemoglobin: 12.9 g/dL (ref 12.0–15.0)
MCH: 30 pg (ref 26.0–34.0)
MCHC: 34.1 g/dL (ref 30.0–36.0)
MCV: 87.9 fL (ref 80.0–100.0)
Platelets: 368 10*3/uL (ref 150–400)
RBC: 4.3 MIL/uL (ref 3.87–5.11)
RDW: 12.2 % (ref 11.5–15.5)
WBC: 6.2 10*3/uL (ref 4.0–10.5)
nRBC: 0 % (ref 0.0–0.2)

## 2024-05-20 LAB — COMPREHENSIVE METABOLIC PANEL WITH GFR
ALT: 22 U/L (ref 0–44)
AST: 18 U/L (ref 15–41)
Albumin: 3.9 g/dL (ref 3.5–5.0)
Alkaline Phosphatase: 64 U/L (ref 38–126)
Anion gap: 8 (ref 5–15)
BUN: 23 mg/dL — ABNORMAL HIGH (ref 6–20)
CO2: 26 mmol/L (ref 22–32)
Calcium: 9.5 mg/dL (ref 8.9–10.3)
Chloride: 104 mmol/L (ref 98–111)
Creatinine, Ser: 0.76 mg/dL (ref 0.44–1.00)
GFR, Estimated: >60 mL/min (ref >60)
Glucose, Bld: 88 mg/dL (ref 70–99)
Potassium: 4.4 mmol/L (ref 3.5–5.1)
Sodium: 138 mmol/L (ref 135–145)
Total Bilirubin: 0.4 mg/dL (ref 0.0–1.2)
Total Protein: 7.8 g/dL (ref 6.5–8.1)

## 2024-05-20 LAB — URINALYSIS, ROUTINE W REFLEX MICROSCOPIC
Bilirubin, UA: NEGATIVE
Glucose, UA: NEGATIVE
Nitrite, UA: NEGATIVE
Specific Gravity, UA: 1.03 (ref 1.005–1.030)
Urobilinogen, Ur: 1 mg/dL (ref 0.2–1.0)
pH, UA: 6 (ref 5.0–7.5)

## 2024-05-20 LAB — MICROSCOPIC EXAMINATION
Epithelial Cells (non renal): >10 /HPF — AB (ref 0–10)
RBC, Urine: >30 /HPF — AB (ref 0–2)
WBC, UA: >30 /HPF — AB (ref 0–5)

## 2024-05-20 NOTE — Progress Notes (Signed)
 Name: Chelsea Little DOB: 1991/09/11 MRN: [id number]  History of Present Illness: Chelsea Little is a 32 y.o. female who presents today as a new patient at [facility] [place]. All available relevant medical records have been reviewed.   She reports concern of kidney stone(s).  - She reports one prior kidney stone in 2022, which passed without intervention. - She reports multiple prior kidney infections (unrelated to stones to her knowledge).  Recent history: > 05/13/2024: Seen in ER for RLQ abdominal pain. CMP with normal renal function (GFR >60; creatinine 0.64). UA showed >50 WBC/hpf, >50 RBC/hpf, rare bacteria. CT showed: 1. 5 mm obstructing calculus at the right ureteropelvic junction. 2. Abnormal enhancement of the right renal pelvic uroepithelium, consistent with ureteritis. 3. Moderate stool retention, consistent with constipation.  She was discharged with prescriptions for Flomax  and Oxycodone .  > 05/15/2024: Went to urgent care with concerns of UTI / possible kidney infection. She was prescribed Bactrim .  > 05/17/2024: Returned to ER for UTI.  - Urine microscopy: 103 WBC/hpf, 1136 RBC/hpf, 0 bacteria - Renal function normal (GFR >60; creatinine 0.89). CBC with no leukocytosis (WBC 5.9). Her antibiotic was switched to Cipro  500 mg 2x/day x7 days. Urine culture came back.   Today: She reports ongoing intermittent episodes of feeling clammy and lightheaded and nausea and overheated. I feel like I can't quench my thirst no matter what I do.  Reports ongoing RLQ pain which is sharp and intermittent. Reports taking Ibuprofen primarily; denies needing to take the Oxycodone  since leaving the ER.  She has been straining her urine and denies passing the stone.  Denies fever at this time. She denies increased urinary urgency, frequency, dysuria, gross hematuria, hesitancy, straining to void, or sensations of incomplete emptying.  She reports that she has had some  constipation recently - reports that she went several days last week without a bowel movement. Denies taking laxatives or stool softeners.   Medications: Current Outpatient Medications  Medication Sig Dispense Refill   cephALEXin  (KEFLEX ) 500 MG capsule Take 1 capsule (500 mg total) by mouth 3 (three) times daily. 21 capsule 0   fluticasone  (FLONASE ) 50 MCG/ACT nasal spray Place 2 sprays into both nostrils daily. 16 g 12   ketoconazole  (NIZORAL ) 2 % cream Apply 1 application topically 2 (two) times daily as needed for irritation. 80 g 0   ketoconazole  (NIZORAL ) 2 % shampoo Apply 1 application topically 2 (two) times daily. 300 mL 0   norethindrone (MICRONOR) 0.35 MG tablet Take 1 tablet by mouth daily.     ondansetron  (ZOFRAN -ODT) 4 MG disintegrating tablet Take 1 tablet (4 mg total) by mouth every 8 (eight) hours as needed for nausea or vomiting. 12 tablet 0   tamsulosin  (FLOMAX ) 0.4 MG CAPS capsule Take 1 capsule (0.4 mg total) by mouth daily after breakfast. 30 capsule 0   triamcinolone  lotion (KENALOG ) 0.1 % Apply 1 application topically 2 (two) times daily. 60 mL 0   erythromycin  ophthalmic ointment Place 1 application. into the left eye at bedtime. (Patient not taking: Reported on 05/20/2024) 3.5 g 0   oxyCODONE  (ROXICODONE ) 5 MG immediate release tablet Take 1 tablet (5 mg total) by mouth every 4 (four) hours as needed for severe pain (pain score 7-10). (Patient not taking: Reported on 05/20/2024) 12 tablet 0   No current facility-administered medications for this visit.    Allergies: Allergies  Allergen Reactions   Topiramate Itching, Other (See Comments) and Rash    syncope syncope  Gabapentin Other (See Comments)    Didn't work for what was prescribed.      Sumatriptan Hives    Migraine products Migraine products Migraine products Migraine products    Amoxicillin-Pot Clavulanate Diarrhea, Itching, Other (See Comments) and Rash    As a child no problems with amoxil As a  child no problems with amoxil    Rizatriptan Rash and Hives    Migraine products Migraine products Migraine products Migraine products     Past Medical History:  Diagnosis Date   Renal disorder    kidney stone   Past Surgical History:  Procedure Laterality Date   CESAREAN SECTION     ORTHOPEDIC SURGERY     TONSILLECTOMY     No family history on file. Social History   Socioeconomic History   Marital status: Single    Spouse name: Not on file   Number of children: Not on file   Years of education: Not on file   Highest education level: Not on file  Occupational History   Not on file  Tobacco Use   Smoking status: Never   Smokeless tobacco: Never  Vaping Use   Vaping status: Never Used  Substance and Sexual Activity   Alcohol use: Not Currently   Drug use: Never   Sexual activity: Yes  Other Topics Concern   Not on file  Social History Narrative   Not on file   Social Drivers of Health   Financial Resource Strain: Low Risk  (05/17/2024)   Received from [facility]   Overall Financial Resource Strain (CARDIA)    How hard is it for you to pay for the very basics like food, housing, medical care, and heating?: Not very hard  Food Insecurity: No Food Insecurity (05/17/2024)   Received from [facility]   Hunger Vital Sign    Within the past 12 months, you worried that your food would run out before you got the money to buy more.: Never true    Within the past 12 months, the food you bought just didn't last and you didn't have money to get more.: Never true  Transportation Needs: No Transportation Needs (05/17/2024)   Received from [facility]   PRAPARE - Transportation    Lack of Transportation (Medical): No    Lack of Transportation (Non-Medical): No  Physical Activity: Not on file  Stress: Not on file  Social Connections: Unknown (03/21/2022)   Received from [facility]   Social Network    Social Network: Not on file  Intimate Partner Violence:  Not At Risk (05/17/2024)   Received from [facility]   Humiliation, Afraid, Rape, and Kick questionnaire    Within the last year, have you been afraid of your partner or ex-partner?: No    Within the last year, have you been humiliated or emotionally abused in other ways by your partner or ex-partner?: No    Within the last year, have you been kicked, hit, slapped, or otherwise physically hurt by your partner or ex-partner?: No    Within the last year, have you been raped or forced to have any kind of sexual activity by your partner or ex-partner?: No    SUBJECTIVE  Review of Systems Constitutional: Patient denies any unintentional weight loss or change in strength lntegumentary: Patient denies any rashes or pruritus Cardiovascular: Patient denies chest pain or syncope Respiratory: Patient denies shortness of breath Gastrointestinal: As per HPI Musculoskeletal: Patient denies muscle cramps or weakness Neurologic:  Patient denies convulsions or seizures Allergic/Immunologic: Patient denies recent allergic reaction(s) Hematologic/Lymphatic: Patient denies bleeding tendencies Endocrine: Patient denies heat/cold intolerance  GU: As per HPI.  OBJECTIVE Vitals:   05/20/24 1128  BP: (!) 77/51  Pulse: 67   There is no height or weight on file to calculate BMI.  Physical Examination Constitutional: No obvious distress; patient is non-toxic appearing  Cardiovascular: No visible lower extremity edema.  Respiratory: The patient does not have audible wheezing/stridor; respirations do not appear labored  Gastrointestinal: Abdomen non-distended Musculoskeletal: Normal ROM of UEs  Skin: No obvious rashes/open sores  Neurologic: CN 2-12 grossly intact Psychiatric: Answered questions appropriately with normal affect  Hematologic/Lymphatic/Immunologic: No obvious bruises or sites of spontaneous bleeding   ASSESSMENT Right ureteral stone - Plan: Urinalysis, Routine w reflex microscopic,  DG Abd 1 View, CBC, Comprehensive metabolic panel, Ambulatory Referral For Surgery Scheduling  Kidney stones - Plan: DG Abd 1 View, CBC, Comprehensive metabolic panel, Ambulatory Referral For Surgery Scheduling  Nausea - Plan: CBC, Comprehensive metabolic panel  Light headedness - Plan: CBC, Comprehensive metabolic panel  For acute GU stone & related symptoms we agreed to proceed with: - KUB  - Repeat CBC & CMP  - Continue Flomax  0.4 mg daily for medical expulsive therapy (MET), which may improve passage of stone(s).  - For pain management, we discussed the use of OTC analgesics versus opioids as prescribed at ER - For nausea / vomiting: Zofran  PRN as prescribed at ER   We discussed the various treatment options including medical expulsive therapy (MET) or extracorporeal shock wave lithotripsy (ESWL). We discussed possible risks and benefits of intervention including but not limited to: including pain, infection, sepsis, UTI, ureter perforation, need for stenting, post-op ureteral stricture, hematuria.   She elected to proceed with ESWL.  She was advised to contact urology provider or go to the ER if She develops fever >101F, uncontrollable pain, or other significantly concerning symptoms prior to next office visit.  She verbalized understanding and agreement. All questions were answered.   PLAN Advised the following: Flomax  daily x2 weeks. Analgesics PRN for pain. Zofran  PRN for nausea. Complete Cipro  as prescribed.  KUB today. Labs today (CBC & CMP). Return for right ESWL with Dr. Sherrilee; KUB & labs today.  Orders Placed This Encounter  Procedures   DG Abd 1 View    Standing Status:   Future    Expected Date:   05/20/2024    Expiration Date:   05/20/2025    Reason for Exam (SYMPTOM  OR DIAGNOSIS REQUIRED):   kidney stone    Is patient pregnant?:   Unknown (Please Explain)    Preferred imaging location?:   [facility]   Urinalysis, Routine w reflex microscopic   CBC    Comprehensive metabolic panel   Ambulatory Referral For Surgery Scheduling    Referral Priority:   Routine    Referral Type:   Consultation    Referred to Provider:   Sherrilee Belvie CROME, MD    Number of Visits Requested:   1   Total time spent caring for the patient today was over 45 minutes. This includes time spent on the date of the visit reviewing the patient's chart before the visit, time spent during the visit, and time spent after the visit on documentation. Over 50% of that time was spent in face-to-face time with this patient for direct counseling. E&M based on time and complexity of medical decision making.  It has been explained that the  patient is to follow regularly with their PCP in addition to all other providers involved in their care and to follow instructions provided by these respective offices. Patient advised to contact urology clinic if any urologic-pertaining questions, concerns, new symptoms or problems arise in the interim period.  Patient Instructions  >80% of stones are calcium oxalate. This type of stones forms when body either isn't clearing oxalate well enough, is making too much oxalate, or too little citrate. This results in oxalate binding to form crystals, which continue to aggregate and form stones.  Limiting calcium does not help, but limiting oxalate in the diet can help. Increasing citric acid intake may also help.  The following measures may help to prevent the recurrence of stones: Increase water intake to 2-2.5 liters per day May add citrus juice (lemon, lime or orange juice) to water Moderation in dairy foods Decrease in salt content 5. Low Oxalate diet: Oxylates are found in foods like Tomato, Spinach, red wine and chocolate (see additional resources below).  Internet resources for information regarding low oxalate  diet: https://[email or web address].yangchunwu.com https://my.VerticalStretch.be  Foods Low in Sodium or Oxalate Foods You Can Eat  Drinks Coffee, fruit and veggie juice (using the recommended veggies), fruit punch  Fruits Apples, apricots (fresh or canned), avocado, bananas, cherries (sweet), cranberries, grapefruit, red or green grapes, lemon and lime juice, melons, nectarines, papayas, peaches, pears, pineapples, oranges, strawberries (fresh), tangerines  Veggies Artichokes, asparagus, bamboo shoots, broccoli, brussels sprouts, cabbage, cauliflower, chayote squash, chicory, corn, cucumbers, endive, lettuce, lima beans, mushrooms, onions, peas, peppers, potatoes, radishes, rutabagas, zucchini  Breads, Cereals, Grains Egg noodles, rye bread, cooked and dry cereals without nuts or bran, crackers with unsalted tops, white or wild rice  Meat, Meat Replacements, Fish, Recruitment consultant, fish, poultry, eggs, egg whites, egg replacements  Soup Homemade soup (using the recommended veggies and meat), low-sodium bouillon, low-sodium canned  Desserts Cookies, cakes, ice cream, pudding without chocolate or nuts, candy without chocolate or nuts  Fats and Oils Butter, margarine, cream, oil, salad dressing, mayo  Other Foods Unsalted potato chips or pretzels, herbs (like garlic, garlic powder, onion powder), lemon juice, salt-free seasoning blends, vinegar  Other Foods Low in Oxalate Foods You Can Eat  Drinks Beer, cola, wine, buttermilk, lemonade or limeade (without added vitamin C), milk  Meat, Meat Replacements, Fish, [employer] meat, ham, bacon, hot dogs, bratwurst, sausage, chicken nuggets, cheddar cheese, canned fish and shellfish  Soup Tomato soup, cheese soup  Other Foods Coconuts, lemon or lime juices, sugar or sweeteners, jellies or jams (from the recommended list)   Moderate-Oxalate Foods Foods to Limit   Drinks  Fruit and veggie juices (from the list below), chocolate milk, rice milk, hot cocoa, tea   Fruits Blackberries, blueberries, black currants, cherries (sour), fruit cocktail, mangoes, orange peel, prunes, purple plums   Veggies Baked beans, carrots, celery, green beans, parsnips, summer squash, tomatoes, turnips   Breads, Cereals, Grains White bread, cornbread or cornmeal, white Keonna muffins, saltine or soda crackers, brown rice, vanilla wafers, spaghetti and other noodles, firm tofu, bagels, oatmeal   Meat/meat replacements, fish, poultry Sardines   Desserts Chocolate cake   Fats and Oils Macadamia nuts, pistachio nuts, [place]any walnuts   Other Foods Jams or jellies (made with the fruits above), pepper    High-Oxalate Foods Foods to Avoid Drinks Chocolate drink mixes, soy milk, Ovaltine, instant iced tea, fruit juices of fruits listed below Fruits Apricots (dried), red currants, figs, kiwi, plums, rhubarb Veggies Beans (wax, dried), beets and  beet greens, chives, collard greens, eggplant, escarole, dark greens of all kinds, leeks, okra, parsley, rutabagas, spinach, Swiss chard, tomato paste, watercress Breads, Cereals, Grains Amaranth, barley, white corn flour, fried potatoes, fruitcake, grits, soybean products, sweet potatoes, wheat germ and bran, buckwheat flour, All Bran cereal, graham crackers, pretzels, whole wheat bread Meat/meat replacements, fish, poultry  Dried beans, peanut butter, soy burgers, miso  Desserts Carob, chocolate, marmalades Fats and Oils Nuts (peanuts, almonds, pecans, cashews, hazelnuts), nut butters, sesame seeds, tahini paste Other Foods Poppy seeds   Electronically signed by: Lauraine KYM Oz, MSN, FNP-C, CUNP 05/20/2024 12:07 PM

## 2024-05-20 NOTE — Patient Instructions (Signed)

## 2024-05-20 NOTE — H&P (View-Only) (Signed)
 Name: Chelsea Little DOB: 1991/09/11 MRN: [id number]  History of Present Illness: Chelsea Little is a 32 y.o. female who presents today as a new patient at [facility] [place]. All available relevant medical records have been reviewed.   She reports concern of kidney stone(s).  - She reports one prior kidney stone in 2022, which passed without intervention. - She reports multiple prior kidney infections (unrelated to stones to her knowledge).  Recent history: > 05/13/2024: Seen in ER for RLQ abdominal pain. CMP with normal renal function (GFR >60; creatinine 0.64). UA showed >50 WBC/hpf, >50 RBC/hpf, rare bacteria. CT showed: 1. 5 mm obstructing calculus at the right ureteropelvic junction. 2. Abnormal enhancement of the right renal pelvic uroepithelium, consistent with ureteritis. 3. Moderate stool retention, consistent with constipation.  She was discharged with prescriptions for Flomax  and Oxycodone .  > 05/15/2024: Went to urgent care with concerns of UTI / possible kidney infection. She was prescribed Bactrim .  > 05/17/2024: Returned to ER for UTI.  - Urine microscopy: 103 WBC/hpf, 1136 RBC/hpf, 0 bacteria - Renal function normal (GFR >60; creatinine 0.89). CBC with no leukocytosis (WBC 5.9). Her antibiotic was switched to Cipro  500 mg 2x/day x7 days. Urine culture came back.   Today: She reports ongoing intermittent episodes of feeling clammy and lightheaded and nausea and overheated. I feel like I can't quench my thirst no matter what I do.  Reports ongoing RLQ pain which is sharp and intermittent. Reports taking Ibuprofen primarily; denies needing to take the Oxycodone  since leaving the ER.  She has been straining her urine and denies passing the stone.  Denies fever at this time. She denies increased urinary urgency, frequency, dysuria, gross hematuria, hesitancy, straining to void, or sensations of incomplete emptying.  She reports that she has had some  constipation recently - reports that she went several days last week without a bowel movement. Denies taking laxatives or stool softeners.   Medications: Current Outpatient Medications  Medication Sig Dispense Refill   cephALEXin  (KEFLEX ) 500 MG capsule Take 1 capsule (500 mg total) by mouth 3 (three) times daily. 21 capsule 0   fluticasone  (FLONASE ) 50 MCG/ACT nasal spray Place 2 sprays into both nostrils daily. 16 g 12   ketoconazole  (NIZORAL ) 2 % cream Apply 1 application topically 2 (two) times daily as needed for irritation. 80 g 0   ketoconazole  (NIZORAL ) 2 % shampoo Apply 1 application topically 2 (two) times daily. 300 mL 0   norethindrone (MICRONOR) 0.35 MG tablet Take 1 tablet by mouth daily.     ondansetron  (ZOFRAN -ODT) 4 MG disintegrating tablet Take 1 tablet (4 mg total) by mouth every 8 (eight) hours as needed for nausea or vomiting. 12 tablet 0   tamsulosin  (FLOMAX ) 0.4 MG CAPS capsule Take 1 capsule (0.4 mg total) by mouth daily after breakfast. 30 capsule 0   triamcinolone  lotion (KENALOG ) 0.1 % Apply 1 application topically 2 (two) times daily. 60 mL 0   erythromycin  ophthalmic ointment Place 1 application. into the left eye at bedtime. (Patient not taking: Reported on 05/20/2024) 3.5 g 0   oxyCODONE  (ROXICODONE ) 5 MG immediate release tablet Take 1 tablet (5 mg total) by mouth every 4 (four) hours as needed for severe pain (pain score 7-10). (Patient not taking: Reported on 05/20/2024) 12 tablet 0   No current facility-administered medications for this visit.    Allergies: Allergies  Allergen Reactions   Topiramate Itching, Other (See Comments) and Rash    syncope syncope  Gabapentin Other (See Comments)    Didn't work for what was prescribed.      Sumatriptan Hives    Migraine products Migraine products Migraine products Migraine products    Amoxicillin-Pot Clavulanate Diarrhea, Itching, Other (See Comments) and Rash    As a child no problems with amoxil As a  child no problems with amoxil    Rizatriptan Rash and Hives    Migraine products Migraine products Migraine products Migraine products     Past Medical History:  Diagnosis Date   Renal disorder    kidney stone   Past Surgical History:  Procedure Laterality Date   CESAREAN SECTION     ORTHOPEDIC SURGERY     TONSILLECTOMY     No family history on file. Social History   Socioeconomic History   Marital status: Single    Spouse name: Not on file   Number of children: Not on file   Years of education: Not on file   Highest education level: Not on file  Occupational History   Not on file  Tobacco Use   Smoking status: Never   Smokeless tobacco: Never  Vaping Use   Vaping status: Never Used  Substance and Sexual Activity   Alcohol use: Not Currently   Drug use: Never   Sexual activity: Yes  Other Topics Concern   Not on file  Social History Narrative   Not on file   Social Drivers of Health   Financial Resource Strain: Low Risk  (05/17/2024)   Received from [facility]   Overall Financial Resource Strain (CARDIA)    How hard is it for you to pay for the very basics like food, housing, medical care, and heating?: Not very hard  Food Insecurity: No Food Insecurity (05/17/2024)   Received from [facility]   Hunger Vital Sign    Within the past 12 months, you worried that your food would run out before you got the money to buy more.: Never true    Within the past 12 months, the food you bought just didn't last and you didn't have money to get more.: Never true  Transportation Needs: No Transportation Needs (05/17/2024)   Received from [facility]   PRAPARE - Transportation    Lack of Transportation (Medical): No    Lack of Transportation (Non-Medical): No  Physical Activity: Not on file  Stress: Not on file  Social Connections: Unknown (03/21/2022)   Received from [facility]   Social Network    Social Network: Not on file  Intimate Partner Violence:  Not At Risk (05/17/2024)   Received from [facility]   Humiliation, Afraid, Rape, and Kick questionnaire    Within the last year, have you been afraid of your partner or ex-partner?: No    Within the last year, have you been humiliated or emotionally abused in other ways by your partner or ex-partner?: No    Within the last year, have you been kicked, hit, slapped, or otherwise physically hurt by your partner or ex-partner?: No    Within the last year, have you been raped or forced to have any kind of sexual activity by your partner or ex-partner?: No    SUBJECTIVE  Review of Systems Constitutional: Patient denies any unintentional weight loss or change in strength lntegumentary: Patient denies any rashes or pruritus Cardiovascular: Patient denies chest pain or syncope Respiratory: Patient denies shortness of breath Gastrointestinal: As per HPI Musculoskeletal: Patient denies muscle cramps or weakness Neurologic:  Patient denies convulsions or seizures Allergic/Immunologic: Patient denies recent allergic reaction(s) Hematologic/Lymphatic: Patient denies bleeding tendencies Endocrine: Patient denies heat/cold intolerance  GU: As per HPI.  OBJECTIVE Vitals:   05/20/24 1128  BP: (!) 77/51  Pulse: 67   There is no height or weight on file to calculate BMI.  Physical Examination Constitutional: No obvious distress; patient is non-toxic appearing  Cardiovascular: No visible lower extremity edema.  Respiratory: The patient does not have audible wheezing/stridor; respirations do not appear labored  Gastrointestinal: Abdomen non-distended Musculoskeletal: Normal ROM of UEs  Skin: No obvious rashes/open sores  Neurologic: CN 2-12 grossly intact Psychiatric: Answered questions appropriately with normal affect  Hematologic/Lymphatic/Immunologic: No obvious bruises or sites of spontaneous bleeding   ASSESSMENT Right ureteral stone - Plan: Urinalysis, Routine w reflex microscopic,  DG Abd 1 View, CBC, Comprehensive metabolic panel, Ambulatory Referral For Surgery Scheduling  Kidney stones - Plan: DG Abd 1 View, CBC, Comprehensive metabolic panel, Ambulatory Referral For Surgery Scheduling  Nausea - Plan: CBC, Comprehensive metabolic panel  Light headedness - Plan: CBC, Comprehensive metabolic panel  For acute GU stone & related symptoms we agreed to proceed with: - KUB  - Repeat CBC & CMP  - Continue Flomax  0.4 mg daily for medical expulsive therapy (MET), which may improve passage of stone(s).  - For pain management, we discussed the use of OTC analgesics versus opioids as prescribed at ER - For nausea / vomiting: Zofran  PRN as prescribed at ER   We discussed the various treatment options including medical expulsive therapy (MET) or extracorporeal shock wave lithotripsy (ESWL). We discussed possible risks and benefits of intervention including but not limited to: including pain, infection, sepsis, UTI, ureter perforation, need for stenting, post-op ureteral stricture, hematuria.   She elected to proceed with ESWL.  She was advised to contact urology provider or go to the ER if She develops fever >101F, uncontrollable pain, or other significantly concerning symptoms prior to next office visit.  She verbalized understanding and agreement. All questions were answered.   PLAN Advised the following: Flomax  daily x2 weeks. Analgesics PRN for pain. Zofran  PRN for nausea. Complete Cipro  as prescribed.  KUB today. Labs today (CBC & CMP). Return for right ESWL with Dr. Sherrilee; KUB & labs today.  Orders Placed This Encounter  Procedures   DG Abd 1 View    Standing Status:   Future    Expected Date:   05/20/2024    Expiration Date:   05/20/2025    Reason for Exam (SYMPTOM  OR DIAGNOSIS REQUIRED):   kidney stone    Is patient pregnant?:   Unknown (Please Explain)    Preferred imaging location?:   [facility]   Urinalysis, Routine w reflex microscopic   CBC    Comprehensive metabolic panel   Ambulatory Referral For Surgery Scheduling    Referral Priority:   Routine    Referral Type:   Consultation    Referred to Provider:   Sherrilee Belvie CROME, MD    Number of Visits Requested:   1   Total time spent caring for the patient today was over 45 minutes. This includes time spent on the date of the visit reviewing the patient's chart before the visit, time spent during the visit, and time spent after the visit on documentation. Over 50% of that time was spent in face-to-face time with this patient for direct counseling. E&M based on time and complexity of medical decision making.  It has been explained that the  patient is to follow regularly with their PCP in addition to all other providers involved in their care and to follow instructions provided by these respective offices. Patient advised to contact urology clinic if any urologic-pertaining questions, concerns, new symptoms or problems arise in the interim period.  Patient Instructions  >80% of stones are calcium oxalate. This type of stones forms when body either isn't clearing oxalate well enough, is making too much oxalate, or too little citrate. This results in oxalate binding to form crystals, which continue to aggregate and form stones.  Limiting calcium does not help, but limiting oxalate in the diet can help. Increasing citric acid intake may also help.  The following measures may help to prevent the recurrence of stones: Increase water intake to 2-2.5 liters per day May add citrus juice (lemon, lime or orange juice) to water Moderation in dairy foods Decrease in salt content 5. Low Oxalate diet: Oxylates are found in foods like Tomato, Spinach, red wine and chocolate (see additional resources below).  Internet resources for information regarding low oxalate  diet: https://kidneystones.yangchunwu.com https://my.VerticalStretch.be  Foods Low in Sodium or Oxalate Foods You Can Eat  Drinks Coffee, fruit and veggie juice (using the recommended veggies), fruit punch  Fruits Apples, apricots (fresh or canned), avocado, bananas, cherries (sweet), cranberries, grapefruit, red or green grapes, lemon and lime juice, melons, nectarines, papayas, peaches, pears, pineapples, oranges, strawberries (fresh), tangerines  Veggies Artichokes, asparagus, bamboo shoots, broccoli, brussels sprouts, cabbage, cauliflower, chayote squash, chicory, corn, cucumbers, endive, lettuce, lima beans, mushrooms, onions, peas, peppers, potatoes, radishes, rutabagas, zucchini  Breads, Cereals, Grains Egg noodles, rye bread, cooked and dry cereals without nuts or bran, crackers with unsalted tops, white or wild rice  Meat, Meat Replacements, Fish, Recruitment consultant, fish, poultry, eggs, egg whites, egg replacements  Soup Homemade soup (using the recommended veggies and meat), low-sodium bouillon, low-sodium canned  Desserts Cookies, cakes, ice cream, pudding without chocolate or nuts, candy without chocolate or nuts  Fats and Oils Butter, margarine, cream, oil, salad dressing, mayo  Other Foods Unsalted potato chips or pretzels, herbs (like garlic, garlic powder, onion powder), lemon juice, salt-free seasoning blends, vinegar  Other Foods Low in Oxalate Foods You Can Eat  Drinks Beer, cola, wine, buttermilk, lemonade or limeade (without added vitamin C), milk  Meat, Meat Replacements, Fish, [employer] meat, ham, bacon, hot dogs, bratwurst, sausage, chicken nuggets, cheddar cheese, canned fish and shellfish  Soup Tomato soup, cheese soup  Other Foods Coconuts, lemon or lime juices, sugar or sweeteners, jellies or jams (from the recommended list)   Moderate-Oxalate Foods Foods to Limit   Drinks  Fruit and veggie juices (from the list below), chocolate milk, rice milk, hot cocoa, tea   Fruits Blackberries, blueberries, black currants, cherries (sour), fruit cocktail, mangoes, orange peel, prunes, purple plums   Veggies Baked beans, carrots, celery, green beans, parsnips, summer squash, tomatoes, turnips   Breads, Cereals, Grains White bread, cornbread or cornmeal, white Keonna muffins, saltine or soda crackers, brown rice, vanilla wafers, spaghetti and other noodles, firm tofu, bagels, oatmeal   Meat/meat replacements, fish, poultry Sardines   Desserts Chocolate cake   Fats and Oils Macadamia nuts, pistachio nuts, Lexany walnuts   Other Foods Jams or jellies (made with the fruits above), pepper    High-Oxalate Foods Foods to Avoid Drinks Chocolate drink mixes, soy milk, Ovaltine, instant iced tea, fruit juices of fruits listed below Fruits Apricots (dried), red currants, figs, kiwi, plums, rhubarb Veggies Beans (wax, dried), beets and  beet greens, chives, collard greens, eggplant, escarole, dark greens of all kinds, leeks, okra, parsley, rutabagas, spinach, Swiss chard, tomato paste, watercress Breads, Cereals, Grains Amaranth, barley, white corn flour, fried potatoes, fruitcake, grits, soybean products, sweet potatoes, wheat germ and bran, buckwheat flour, All Bran cereal, graham crackers, pretzels, whole wheat bread Meat/meat replacements, fish, poultry  Dried beans, peanut butter, soy burgers, miso  Desserts Carob, chocolate, marmalades Fats and Oils Nuts (peanuts, almonds, pecans, cashews, hazelnuts), nut butters, sesame seeds, tahini paste Other Foods Poppy seeds   Electronically signed by: Lauraine KYM Oz, MSN, FNP-C, CUNP 05/20/2024 12:07 PM

## 2024-05-21 ENCOUNTER — Ambulatory Visit: Payer: Self-pay | Admitting: Urology

## 2024-05-21 ENCOUNTER — Other Ambulatory Visit: Payer: Self-pay

## 2024-05-21 DIAGNOSIS — N201 Calculus of ureter: Secondary | ICD-10-CM

## 2024-05-25 ENCOUNTER — Encounter (HOSPITAL_COMMUNITY): Payer: Self-pay

## 2024-05-25 ENCOUNTER — Encounter (HOSPITAL_COMMUNITY)
Admission: RE | Admit: 2024-05-25 | Discharge: 2024-05-25 | Disposition: A | Discharge status: Home / Self Care | Source: Ambulatory Visit | Attending: Urology | Admitting: Urology

## 2024-05-25 ENCOUNTER — Other Ambulatory Visit: Payer: Self-pay

## 2024-05-25 NOTE — Progress Notes (Signed)
 PAT phone call completed. Pt verbalized understanding on procedure instructions and arrival time

## 2024-05-25 NOTE — Pre-Procedure Instructions (Signed)
 Attempted pre-op phonecall. Left VM for her to call us back.

## 2024-05-26 ENCOUNTER — Encounter (HOSPITAL_COMMUNITY): Payer: Self-pay | Admitting: Urology

## 2024-05-26 ENCOUNTER — Ambulatory Visit (HOSPITAL_COMMUNITY)
Admission: RE | Admit: 2024-05-26 | Discharge: 2024-05-26 | Disposition: A | Discharge status: Home / Self Care | Source: Ambulatory Visit | Attending: Urology | Admitting: Urology

## 2024-05-26 ENCOUNTER — Encounter (HOSPITAL_COMMUNITY)
Admission: RE | Disposition: A | Discharge status: Home / Self Care | Payer: Self-pay | Source: Ambulatory Visit | Attending: Urology

## 2024-05-26 DIAGNOSIS — N200 Calculus of kidney: Secondary | ICD-10-CM

## 2024-05-26 DIAGNOSIS — N202 Calculus of kidney with calculus of ureter: Secondary | ICD-10-CM | POA: Diagnosis not present

## 2024-05-26 DIAGNOSIS — Z01818 Encounter for other preprocedural examination: Secondary | ICD-10-CM

## 2024-05-26 DIAGNOSIS — N201 Calculus of ureter: Secondary | ICD-10-CM

## 2024-05-26 HISTORY — PX: EXTRACORPOREAL SHOCK WAVE LITHOTRIPSY: SHX1557

## 2024-05-26 SURGERY — LITHOTRIPSY, ESWL
Anesthesia: LOCAL | Laterality: Right

## 2024-05-26 MED ORDER — OXYCODONE HCL 5 MG PO TABS: 5.0000 mg | ORAL_TABLET | ORAL | 0 refills | Status: DC | PRN

## 2024-05-26 MED ORDER — ONDANSETRON 4 MG PO TBDP: 4.0000 mg | ORAL_TABLET | Freq: Three times a day (TID) | ORAL | 0 refills | Status: DC | PRN

## 2024-05-26 MED ORDER — TAMSULOSIN HCL 0.4 MG PO CAPS: 0.4000 mg | ORAL_CAPSULE | Freq: Every day | ORAL | 0 refills | Status: DC

## 2024-05-26 MED ORDER — DIPHENHYDRAMINE HCL 25 MG PO CAPS
25.0000 mg | ORAL_CAPSULE | ORAL | Status: AC
  Administered 2024-05-26: 25 mg via ORAL
  Filled 2024-05-26: qty 1

## 2024-05-26 MED ORDER — SODIUM CHLORIDE 0.9 % IV SOLN: INTRAVENOUS | Status: DC

## 2024-05-26 MED ORDER — DIAZEPAM 5 MG PO TABS
10.0000 mg | ORAL_TABLET | Freq: Once | ORAL | Status: AC
  Administered 2024-05-26: 10 mg via ORAL
  Filled 2024-05-26: qty 2

## 2024-05-26 NOTE — Interval H&P Note (Signed)
 History and Physical Interval Note:  05/26/2024 11:06 AM  Chelsea Little  has presented today for surgery, with the diagnosis of right UPJ stone.  The various methods of treatment have been discussed with the patient and family. After consideration of risks, benefits and other options for treatment, the patient has consented to  Procedure(s): LITHOTRIPSY, ESWL (Right) as a surgical intervention.  The patient's history has been reviewed, patient examined, no change in status, stable for surgery.  I have reviewed the patient's chart and labs.  Questions were answered to the patient's satisfaction.     Chelsea Little

## 2024-05-26 NOTE — Progress Notes (Signed)
 Red area on right side,  small area with very small amount of bleeding.

## 2024-05-27 ENCOUNTER — Encounter (HOSPITAL_COMMUNITY): Payer: Self-pay | Admitting: Urology

## 2024-06-10 ENCOUNTER — Encounter: Admitting: Urology

## 2024-07-10 ENCOUNTER — Telehealth: Payer: Self-pay

## 2024-07-10 DIAGNOSIS — N201 Calculus of ureter: Secondary | ICD-10-CM

## 2024-07-10 NOTE — Telephone Encounter (Signed)
 Patient post ESWL upcoming appointment cancelled. My chart message sent. Attempted to reach patient by phone- no answer.  Patient will have KUB post op ESWL per Dr. Sherrilee and we will call with results.

## 2024-07-13 ENCOUNTER — Encounter: Admitting: Urology

## 2024-07-13 ENCOUNTER — Ambulatory Visit (HOSPITAL_COMMUNITY)
Admission: RE | Admit: 2024-07-13 | Discharge: 2024-07-13 | Disposition: A | Discharge status: Home / Self Care | Source: Ambulatory Visit | Attending: Urology | Admitting: Urology

## 2024-07-13 DIAGNOSIS — N201 Calculus of ureter: Secondary | ICD-10-CM | POA: Insufficient documentation

## 2024-07-28 ENCOUNTER — Ambulatory Visit: Payer: Self-pay

## 2024-07-28 ENCOUNTER — Other Ambulatory Visit: Payer: Self-pay

## 2024-07-28 DIAGNOSIS — N200 Calculus of kidney: Secondary | ICD-10-CM

## 2024-09-01 ENCOUNTER — Ambulatory Visit (HOSPITAL_COMMUNITY)
Admission: RE | Admit: 2024-09-01 | Discharge: 2024-09-01 | Disposition: A | Discharge status: Home / Self Care | Source: Ambulatory Visit | Attending: Urology | Admitting: Urology

## 2024-09-01 DIAGNOSIS — N200 Calculus of kidney: Secondary | ICD-10-CM | POA: Insufficient documentation

## 2024-09-08 ENCOUNTER — Ambulatory Visit: Payer: Self-pay | Admitting: Urology

## 2024-09-08 NOTE — Telephone Encounter (Signed)
**Note De-identified  [place] Obfuscation** Please advise 

## 2024-09-15 NOTE — Telephone Encounter (Signed)
 Please advise on pt questions treatment options details were given to pt

## 2024-09-22 NOTE — Telephone Encounter (Signed)
**Note De-identified  [place] Obfuscation** Please advise 

## 2024-10-02 NOTE — Telephone Encounter (Signed)
 Please advise pt

## 2024-10-02 NOTE — Telephone Encounter (Signed)
 His litho dates are every Tuesday, next availability is 11/18 and 11/25.  I will need a surgery request from MD.  Ill attach him.

## 2024-10-06 NOTE — Telephone Encounter (Signed)
 Please submit litho request for 11/25 if appropriate.

## 2024-10-08 ENCOUNTER — Other Ambulatory Visit: Payer: Self-pay | Admitting: Urology

## 2024-10-08 DIAGNOSIS — N200 Calculus of kidney: Secondary | ICD-10-CM

## 2024-10-09 ENCOUNTER — Other Ambulatory Visit: Payer: Self-pay

## 2024-10-09 DIAGNOSIS — N200 Calculus of kidney: Secondary | ICD-10-CM

## 2024-10-12 ENCOUNTER — Other Ambulatory Visit

## 2024-10-12 ENCOUNTER — Encounter (HOSPITAL_COMMUNITY)
Admission: RE | Admit: 2024-10-12 | Discharge: 2024-10-12 | Disposition: A | Discharge status: Home / Self Care | Source: Ambulatory Visit | Attending: Urology | Admitting: Urology

## 2024-10-12 ENCOUNTER — Other Ambulatory Visit: Payer: Self-pay

## 2024-10-12 DIAGNOSIS — Z01818 Encounter for other preprocedural examination: Secondary | ICD-10-CM

## 2024-10-12 DIAGNOSIS — N200 Calculus of kidney: Secondary | ICD-10-CM

## 2024-10-12 NOTE — Pre-Procedure Instructions (Signed)
 Attempted pre-op phonecall. Left VM for her to call us  back.

## 2024-10-13 ENCOUNTER — Encounter (HOSPITAL_COMMUNITY)
Admission: RE | Disposition: A | Discharge status: Home / Self Care | Payer: Self-pay | Source: Home / Self Care | Attending: Urology

## 2024-10-13 ENCOUNTER — Ambulatory Visit (HOSPITAL_COMMUNITY)

## 2024-10-13 ENCOUNTER — Other Ambulatory Visit: Payer: Self-pay | Admitting: Urology

## 2024-10-13 ENCOUNTER — Encounter (HOSPITAL_COMMUNITY): Payer: Self-pay | Admitting: Urology

## 2024-10-13 ENCOUNTER — Ambulatory Visit (HOSPITAL_COMMUNITY)
Admission: RE | Admit: 2024-10-13 | Discharge: 2024-10-13 | Disposition: A | Discharge status: Home / Self Care | Attending: Urology | Admitting: Urology

## 2024-10-13 DIAGNOSIS — I499 Cardiac arrhythmia, unspecified: Secondary | ICD-10-CM | POA: Insufficient documentation

## 2024-10-13 DIAGNOSIS — Z01818 Encounter for other preprocedural examination: Secondary | ICD-10-CM

## 2024-10-13 DIAGNOSIS — Z87442 Personal history of urinary calculi: Secondary | ICD-10-CM | POA: Insufficient documentation

## 2024-10-13 DIAGNOSIS — N200 Calculus of kidney: Secondary | ICD-10-CM | POA: Insufficient documentation

## 2024-10-13 HISTORY — PX: EXTRACORPOREAL SHOCK WAVE LITHOTRIPSY: SHX1557

## 2024-10-13 LAB — URINALYSIS, ROUTINE W REFLEX MICROSCOPIC
Bilirubin, UA: NEGATIVE
Glucose, UA: NEGATIVE
Leukocytes,UA: NEGATIVE
Nitrite, UA: NEGATIVE
Protein,UA: NEGATIVE
Specific Gravity, UA: 1.02 (ref 1.005–1.030)
Urobilinogen, Ur: 1 mg/dL (ref 0.2–1.0)
pH, UA: 6 (ref 5.0–7.5)

## 2024-10-13 LAB — MICROSCOPIC EXAMINATION: Bacteria, UA: NONE SEEN

## 2024-10-13 LAB — POCT PREGNANCY, URINE: Preg Test, Ur: NEGATIVE

## 2024-10-13 SURGERY — LITHOTRIPSY, ESWL
Anesthesia: LOCAL | Laterality: Right

## 2024-10-13 MED ORDER — DIAZEPAM 5 MG PO TABS
10.0000 mg | ORAL_TABLET | Freq: Once | ORAL | Status: AC
  Administered 2024-10-13: 10 mg via ORAL
  Filled 2024-10-13: qty 2

## 2024-10-13 MED ORDER — OXYCODONE HCL 5 MG PO TABS: 5.0000 mg | ORAL_TABLET | ORAL | 0 refills | Status: AC | PRN

## 2024-10-13 MED ORDER — TAMSULOSIN HCL 0.4 MG PO CAPS: 0.4000 mg | ORAL_CAPSULE | Freq: Every day | ORAL | 0 refills | Status: DC

## 2024-10-13 MED ORDER — ONDANSETRON 4 MG PO TBDP: 4.0000 mg | ORAL_TABLET | Freq: Three times a day (TID) | ORAL | 0 refills | Status: AC | PRN

## 2024-10-13 MED ORDER — SODIUM CHLORIDE 0.9 % IV SOLN: INTRAVENOUS | Status: DC

## 2024-10-13 MED ORDER — DIPHENHYDRAMINE HCL 25 MG PO CAPS
25.0000 mg | ORAL_CAPSULE | ORAL | Status: AC
  Administered 2024-10-13: 25 mg via ORAL
  Filled 2024-10-13: qty 1

## 2024-10-13 NOTE — H&P (Signed)
 Urology Admission H&P  Chief Complaint: Right renal calculus  History of Present Illness: Chelsea Little is a 33yo here for ESWL fro a right renal calculus. She is having intermittent right flank pain that is sharp, mild to moderate, intermittent and nonradiating. No fevers. No LUTS  Past Medical History:  Diagnosis Date   Renal disorder    kidney stone   Past Surgical History:  Procedure Laterality Date   CESAREAN SECTION     EXTRACORPOREAL SHOCK WAVE LITHOTRIPSY Right 05/26/2024   Procedure: LITHOTRIPSY, ESWL;  Surgeon: Sherrilee Chelsea CROME, MD;  Location: [facility];  Service: Urology;  Laterality: Right;   ORTHOPEDIC SURGERY     TONSILLECTOMY      Home Medications:  Current Facility-Administered Medications  Medication Dose Route Frequency Provider Last Rate Last Admin   0.9 %  sodium chloride  infusion   Intravenous Continuous Sherrilee Chelsea CROME, MD 10 mL/hr at 10/13/24 0807 New Bag at 10/13/24 0807   Allergies:  Allergies  Allergen Reactions   Topiramate Itching, Other (See Comments) and Rash    syncope syncope    Gabapentin Other (See Comments)    Didn't work for what was prescribed.      Sumatriptan Hives    Migraine products Migraine products Migraine products Migraine products    Amoxicillin-Pot Clavulanate Diarrhea, Itching, Other (See Comments) and Rash    As a child no problems with amoxil As a child no problems with amoxil    Rizatriptan Rash and Hives    Migraine products Migraine products Migraine products Migraine products     History reviewed. No pertinent family history. Social History:  reports that she has never smoked. She has never used smokeless tobacco. She reports that she does not currently use alcohol. She reports that she does not use drugs.  Review of Systems  Genitourinary:  Positive for flank pain.  All other systems reviewed and are negative.   Physical Exam:  Vital signs in last 24 hours: Temp:  [97.5 F (36.4 C)] 97.5 F (36.4 C)  (11/25 0807) Resp:  [14] 14 (11/25 0807) BP: (108)/(75) 108/75 (11/25 0807) SpO2:  [100 %] 100 % (11/25 0807) Weight:  [72.6 kg] 72.6 kg (11/25 9191) Physical Exam Vitals reviewed.  Constitutional:      Appearance: Normal appearance.  HENT:     Head: Normocephalic and atraumatic.     Mouth/Throat:     Mouth: Mucous membranes are dry.  Eyes:     Extraocular Movements: Extraocular movements intact.     Pupils: Pupils are equal, round, and reactive to light.  Cardiovascular:     Rate and Rhythm: Normal rate and regular rhythm.  Pulmonary:     Effort: Pulmonary effort is normal. No respiratory distress.  Abdominal:     General: Abdomen is flat. There is no distension.  Musculoskeletal:        General: No swelling. Normal range of motion.     Cervical back: Normal range of motion and neck supple.  Skin:    General: Skin is warm and dry.  Neurological:     General: No focal deficit present.     Mental Status: She is alert and oriented to person, place, and time.  Psychiatric:        Mood and Affect: Mood normal.        Behavior: Behavior normal.        Thought Content: Thought content normal.        Judgment: Judgment normal.     Laboratory  Data:  Results for orders placed or performed during the hospital encounter of 10/13/24 (from the past 24 hours)  Pregnancy, urine POC     Status: None   Collection Time: 10/13/24  7:59 AM  Result Value Ref Range   Preg Test, Ur NEGATIVE NEGATIVE   Recent Results (from the past 240 hours)  Microscopic Examination     Status: Abnormal   Collection Time: 10/12/24  4:09 PM   Urine  Result Value Ref Range Status   WBC, UA 0-5 0 - 5 /hpf Final   RBC, Urine 3-10 (A) 0 - 2 /hpf Final   Epithelial Cells (non renal) 0-10 0 - 10 /hpf Final   Bacteria, UA None seen None seen/Few Final   Creatinine: No results for input(s): CREATININE in the last 168 hours. Baseline Creatinine:   Impression/Assessment:  33yo with right renal  calculus  Plan:  -We discussed the management of kidney stones. These options include observation, ureteroscopy, shockwave lithotripsy (ESWL) and percutaneous nephrolithotomy (PCNL). We discussed which options are relevant to the patient's stone(s). We discussed the natural history of kidney stones as well as the complications of untreated stones and the impact on quality of life without treatment as well as with each of the above listed treatments. We also discussed the efficacy of each treatment in its ability to clear the stone burden. With any of these management options I discussed the signs and symptoms of infection and the need for emergent treatment should these be experienced. For each option we discussed the ability of each procedure to clear the patient of their stone burden.   For observation I described the risks which include but are not limited to silent renal damage, life-threatening infection, need for emergent surgery, failure to pass stone and pain.   For ureteroscopy I described the risks which include bleeding, infection, damage to contiguous structures, positioning injury, ureteral stricture, ureteral avulsion, ureteral injury, need for prolonged ureteral stent, inability to perform ureteroscopy, need for an interval procedure, inability to clear stone burden, stent discomfort/pain, heart attack, stroke, pulmonary embolus and the inherent risks with general anesthesia.   For shockwave lithotripsy I described the risks which include arrhythmia, kidney contusion, kidney hemorrhage, need for transfusion, pain, inability to adequately break up stone, inability to pass stone fragments, Steinstrasse, infection associated with obstructing stones, need for alternate surgical procedure, need for repeat shockwave lithotripsy, MI, CVA, PE and the inherent risks with anesthesia/conscious sedation.   For PCNL I described the risks including positioning injury, pneumothorax, hydrothorax, need for  chest tube, inability to clear stone burden, renal laceration, arterial venous fistula or malformation, need for embolization of kidney, loss of kidney or renal function, need for repeat procedure, need for prolonged nephrostomy tube, ureteral avulsion, MI, CVA, PE and the inherent risks of general anesthesia.   - The patient would like to proceed with right ESWL  Chelsea Little 10/13/2024, 8:46 AM

## 2024-10-13 NOTE — Progress Notes (Signed)
 Assessed pt right posterior side where treatment was performed. Slight redness noted, no complaints.

## 2024-10-14 ENCOUNTER — Encounter (HOSPITAL_COMMUNITY): Payer: Self-pay | Admitting: Urology

## 2024-11-06 ENCOUNTER — Ambulatory Visit (HOSPITAL_COMMUNITY)
Admission: RE | Admit: 2024-11-06 | Discharge: 2024-11-06 | Disposition: A | Discharge status: Home / Self Care | Source: Ambulatory Visit | Attending: Urology | Admitting: Urology

## 2024-11-06 DIAGNOSIS — N200 Calculus of kidney: Secondary | ICD-10-CM | POA: Diagnosis present

## 2024-11-24 ENCOUNTER — Ambulatory Visit: Payer: Self-pay

## 2024-11-25 ENCOUNTER — Other Ambulatory Visit: Payer: Self-pay

## 2024-11-25 DIAGNOSIS — N200 Calculus of kidney: Secondary | ICD-10-CM

## 2025-06-04 ENCOUNTER — Ambulatory Visit: Admitting: Urology
